# Patient Record
Sex: Female | Born: 1990 | Race: White | Hispanic: Yes | Marital: Single | Smoking: Never smoker
Health system: Southern US, Community
[De-identification: ages and names within clinical notes are randomized; demographics above are authoritative.]

## PROBLEM LIST (undated history)

## (undated) ENCOUNTER — Inpatient Hospital Stay (HOSPITAL_COMMUNITY): Payer: Self-pay

## (undated) DIAGNOSIS — Z789 Other specified health status: Secondary | ICD-10-CM

## (undated) HISTORY — PX: NO PAST SURGERIES: SHX2092

---
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
## 2013-04-16 ENCOUNTER — Ambulatory Visit: Payer: No Typology Code available for payment source | Attending: Internal Medicine

## 2013-04-30 ENCOUNTER — Encounter: Payer: Self-pay | Admitting: Internal Medicine

## 2013-04-30 ENCOUNTER — Ambulatory Visit: Payer: No Typology Code available for payment source | Attending: Internal Medicine | Admitting: Internal Medicine

## 2013-04-30 VITALS — BP 107/68 | HR 79 | Temp 98.7°F | Resp 14 | Ht 60.0 in | Wt 102.8 lb

## 2013-04-30 DIAGNOSIS — Z833 Family history of diabetes mellitus: Secondary | ICD-10-CM | POA: Insufficient documentation

## 2013-04-30 DIAGNOSIS — Z008 Encounter for other general examination: Secondary | ICD-10-CM | POA: Insufficient documentation

## 2013-04-30 DIAGNOSIS — N946 Dysmenorrhea, unspecified: Secondary | ICD-10-CM | POA: Insufficient documentation

## 2013-04-30 DIAGNOSIS — Z0000 Encounter for general adult medical examination without abnormal findings: Secondary | ICD-10-CM

## 2013-04-30 LAB — COMPLETE METABOLIC PANEL WITH GFR
ALK PHOS: 79 U/L (ref 39–117)
ALT: 12 U/L (ref 0–35)
AST: 17 U/L (ref 0–37)
Albumin: 4.3 g/dL (ref 3.5–5.2)
BUN: 11 mg/dL (ref 6–23)
CALCIUM: 9 mg/dL (ref 8.4–10.5)
CHLORIDE: 103 meq/L (ref 96–112)
CO2: 27 mEq/L (ref 19–32)
Creat: 0.63 mg/dL (ref 0.50–1.10)
GFR, Est Non African American: >89 mL/min
GLUCOSE: 94 mg/dL (ref 70–99)
POTASSIUM: 4.3 meq/L (ref 3.5–5.3)
Sodium: 140 mEq/L (ref 135–145)
Total Bilirubin: 0.4 mg/dL (ref 0.2–1.2)
Total Protein: 6.7 g/dL (ref 6.0–8.3)

## 2013-04-30 LAB — LIPID PANEL
Cholesterol: 151 mg/dL (ref 0–200)
HDL: 40 mg/dL (ref >39)
LDL CALC: 97 mg/dL (ref 0–99)
TRIGLYCERIDES: 69 mg/dL (ref <150)
Total CHOL/HDL Ratio: 3.8 Ratio
VLDL: 14 mg/dL (ref 0–40)

## 2013-04-30 LAB — CBC WITH DIFFERENTIAL/PLATELET
BASOS PCT: 1 % (ref 0–1)
Basophils Absolute: 0 10*3/uL (ref 0.0–0.1)
Eosinophils Absolute: 0.2 10*3/uL (ref 0.0–0.7)
Eosinophils Relative: 4 % (ref 0–5)
HEMATOCRIT: 35 % — AB (ref 36.0–46.0)
HEMOGLOBIN: 12.1 g/dL (ref 12.0–15.0)
LYMPHS PCT: 34 % (ref 12–46)
Lymphs Abs: 1.8 10*3/uL (ref 0.7–4.0)
MCH: 30 pg (ref 26.0–34.0)
MCHC: 34.6 g/dL (ref 30.0–36.0)
MCV: 86.6 fL (ref 78.0–100.0)
MONOS PCT: 10 % (ref 3–12)
Monocytes Absolute: 0.5 10*3/uL (ref 0.1–1.0)
NEUTROS ABS: 2.6 10*3/uL (ref 1.7–7.7)
NEUTROS PCT: 51 % (ref 43–77)
PLATELETS: 335 10*3/uL (ref 150–400)
RBC: 4.04 MIL/uL (ref 3.87–5.11)
RDW: 12.8 % (ref 11.5–15.5)
WBC: 5.1 10*3/uL (ref 4.0–10.5)

## 2013-04-30 LAB — TSH: TSH: 1.016 u[IU]/mL (ref 0.350–4.500)

## 2013-04-30 MED ORDER — HYDROCORTISONE 0.5 % EX OINT: 1.0000 "application " | TOPICAL_OINTMENT | Freq: Two times a day (BID) | CUTANEOUS | Status: DC

## 2013-04-30 NOTE — Progress Notes (Signed)
Patient ID: Destiny [name], female   DOB: 1990-04-03, 23 y.o.   MRN: [id number]   CC:  HPI: 23 year old female here to establish care. The patient has no complaints. She saw a V-shaped rash on her chest couple of weeks ago but that rash is improved without any treatment. She denied any recent history of travel and history of tick bite. No history of eczema or any other skin disorders Patient is otherwise in good health, she'll occasionally have severe menstrual cramps for which he takes ibuprofen. She is not interested in discussing contraception and is currently not sexually active. She is up-to-date with her childhood immunization and is not interested in vaccination today  Social history nonsmoker nonalcoholic  Family history father has asthma, grandmother has diabetes      Not on File History reviewed. No pertinent past medical history. No current outpatient prescriptions on file prior to visit.   No current facility-administered medications on file prior to visit.   Family History  Problem Relation Age of Onset  . Asthma Father   . Diabetes Maternal Grandmother   . Arthritis Paternal Grandmother    History   Social History  . Marital Status: Single    Spouse Name: N/A    Number of Children: N/A  . Years of Education: N/A   Occupational History  . Not on file.   Social History Main Topics  . Smoking status: Never Smoker   . Smokeless tobacco: Not on file  . Alcohol Use: No  . Drug Use: No  . Sexual Activity: Not on file   Other Topics Concern  . Not on file   Social History Narrative  . No narrative on file    Review of Systems  Constitutional: Negative for fever, chills, diaphoresis, activity change, appetite change and fatigue.  HENT: Negative for ear pain, nosebleeds, congestion, facial swelling, rhinorrhea, neck pain, neck stiffness and ear discharge.   Eyes: Negative for pain, discharge, redness, itching and visual disturbance.  Respiratory: Negative  for cough, choking, chest tightness, shortness of breath, wheezing and stridor.   Cardiovascular: Negative for chest pain, palpitations and leg swelling.  Gastrointestinal: Negative for abdominal distention.  Genitourinary: Negative for dysuria, urgency, frequency, hematuria, flank pain, decreased urine volume, difficulty urinating and dyspareunia.  Musculoskeletal: Negative for back pain, joint swelling, arthralgias and gait problem.  Neurological: Negative for dizziness, tremors, seizures, syncope, facial asymmetry, speech difficulty, weakness, light-headedness, numbness and headaches.  Hematological: Negative for adenopathy. Does not bruise/bleed easily.  Psychiatric/Behavioral: Negative for hallucinations, behavioral problems, confusion, dysphoric mood, decreased concentration and agitation.    Objective:   Filed Vitals:   04/30/13 1105  BP: 107/68  Pulse: 79  Temp: 98.7 F (37.1 C)  Resp: 14    Physical Exam  Constitutional: Appears well-developed and well-nourished. No distress.  HENT: Normocephalic. External right and left ear normal. Oropharynx is clear and moist.  Eyes: Conjunctivae and EOM are normal. PERRLA, no scleral icterus.  Neck: Normal ROM. Neck supple. No JVD. No tracheal deviation. No thyromegaly.  CVS: RRR, S1/S2 +, no murmurs, no gallops, no carotid bruit.  Pulmonary: Effort and breath sounds normal, no stridor, rhonchi, wheezes, rales.  Abdominal: Soft. BS +,  no distension, tenderness, rebound or guarding.  Musculoskeletal: Normal range of motion. No edema and no tenderness.  Lymphadenopathy: No lymphadenopathy noted, cervical, inguinal. Neuro: Alert. Normal reflexes, muscle tone coordination. No cranial nerve deficit. Skin: Skin is warm and dry. No rash noted. Not diaphoretic. No erythema. No pallor.  Psychiatric: Normal mood and affect. Behavior, judgment, thought content normal.   Lab Results  Component Value Date   WBC 5.7 04/25/2009   HGB 12.2 04/25/2009    HCT 36.5 04/25/2009   MCV 88.2 04/25/2009   PLT 331 04/25/2009   Lab Results  Component Value Date   CREATININE 0.69 04/25/2009   BUN 7 04/25/2009   NA 140 04/25/2009   K 3.7 04/25/2009   CL 104 04/25/2009   CO2 25 04/25/2009    No results found for this basename: HGBA1C   Lipid Panel  No results found for this basename: chol, trig, hdl, cholhdl, vldl, ldlcalc       Assessment and plan:   Patient Active Problem List   Diagnosis Date Noted  . SINUSITIS, MAXILLARY, ACUTE 03/26/2009  . ANEMIA, IRON DEFICIENCY NOS 10/27/2006  . ALLERGIC RHINITIS 10/27/2006  . PRIMARY DYSMENORRHEA 10/27/2006   Establish care Obtain baseline labs Gynecology referral for a Pap smear the patient has never had a Pap smear She will call us back if she wants to discuss options for contraception, although I did go over several different options available, she's not interested in pursuing any of them today She's not interested in immunization today she is up-to-date with her childhood immunization Hydrocortisone cream in case the patient's rash returns, the patient has also been advised to come back and see [place] if this happens  Otherwise followup in 6 months      The patient was given clear instructions to go to ER or return to medical center if symptoms don't improve, worsen or new problems develop. The patient verbalized understanding. The patient was told to call to get any lab results if not heard anything in the next week.

## 2013-04-30 NOTE — Progress Notes (Signed)
Pt is here to establish care. Requests an annual physical. Complains of light colored pink spots around collar bone x3 days. Spots come and go. No pain today.

## 2013-05-01 ENCOUNTER — Telehealth: Payer: Self-pay | Admitting: *Deleted

## 2013-05-01 NOTE — Telephone Encounter (Signed)
Message copied by Nayab Aten, [place] on Tue May 01, 2013  5:25 PM ------      Message from: Susie CassetteABROL MD, [facility]      Created: Tue May 01, 2013  4:19 PM       Notify patient of the labs are normal ------

## 2013-05-01 NOTE — Telephone Encounter (Signed)
Tried leaving a voicemail for patient to give us a call back.  Voicemail box is not set up yet. Unable to reach patient.

## 2013-10-29 ENCOUNTER — Ambulatory Visit: Payer: Self-pay | Admitting: Internal Medicine

## 2015-01-15 ENCOUNTER — Ambulatory Visit (INDEPENDENT_AMBULATORY_CARE_PROVIDER_SITE_OTHER): Payer: BLUE CROSS/BLUE SHIELD | Admitting: Family Medicine

## 2015-01-15 VITALS — BP 90/60 | HR 88 | Temp 98.5°F | Resp 16 | Ht 60.0 in | Wt 99.8 lb

## 2015-01-15 DIAGNOSIS — J029 Acute pharyngitis, unspecified: Secondary | ICD-10-CM | POA: Diagnosis not present

## 2015-01-15 MED ORDER — AMOXICILLIN 875 MG PO TABS: 875.0000 mg | ORAL_TABLET | Freq: Two times a day (BID) | ORAL | Status: DC

## 2015-01-15 NOTE — Progress Notes (Signed)
Patient ID: Destiny [name], female   DOB: 1990-08-14, 24 y.o.   MRN: [id number]  By signing my name below, I, Essence Howell, attest that this documentation has been prepared under the direction and in the presence of Elvina Sidle, MD Electronically Signed: Charline Bills, ED Scribe 01/15/2015 at 12:30 PM.  Patient ID: Destiny [name] MRN: [id number], DOB: Mar 27, 1990, 24 y.o. Date of Encounter: 01/15/2015, 12:17 PM  Primary Physician: Doris Cheadle, MD  Chief Complaint  Patient presents with  . Headache    x 1 week  . Abdominal Pain    x 4 days  . Dizziness    HPI: 24 y.o. year old female with history below presents with persistent frontal headaches for the past week. Pt reports associated chills, night sweats, dizziness, sore throat worsened with swallowing, mild dry cough, generalized body aches, nausea, 1 episode of emesis yesterday, abdominal pain for the past 4 days. No treatments tried PTA. No sick contacts. Pt's LNMP was last week; she denies possibility of pregnancy.   Pt works at a call center.   History reviewed. No pertinent past medical history.   Home Meds: Prior to Admission medications   Not on File    Allergies: No Known Allergies  Social History   Social History  . Marital Status: Single    Spouse Name: N/A  . Number of Children: N/A  . Years of Education: N/A   Occupational History  . Not on file.   Social History Main Topics  . Smoking status: Never Smoker   . Smokeless tobacco: Not on file  . Alcohol Use: No  . Drug Use: No  . Sexual Activity: Not on file   Other Topics Concern  . Not on file   Social History Narrative    Review of Systems: Constitutional: negative for fever, weight changes, or fatigue, +chills, +night sweats HEENT: negative for vision changes, hearing loss, congestion, rhinorrhea, epistaxis, or sinus pressure, +ST Cardiovascular: negative for chest pain or palpitations Respiratory: negative for hemoptysis, wheezing,  shortness of breath, +cough Abdominal: negative for diarrhea, or constipation, +abdominal pain, +nausea, +vomiting Msk: +myalgias  Dermatological: negative for rash Neurologic: negative for syncope, +headache, +dizziness,  All other systems reviewed and are otherwise negative with the exception to those above and in the HPI.  Physical Exam: Blood pressure 90/60, pulse 88, temperature 98.5 F (36.9 C), temperature source Oral, resp. rate 16, height 5' (1.524 m), weight 99 lb 12.8 oz (45.269 kg), last menstrual period 01/06/2015, SpO2 99 %., Body mass index is 19.49 kg/(m^2). General: Well developed, well nourished, in no acute distress. Head: Normocephalic, atraumatic, eyes without discharge, sclera non-icteric, nares are without discharge. Bilateral auditory canals clear, TM's are without perforation, pearly grey and translucent with reflective cone of light bilaterally. Oral cavity moist, posterior pharynx with mild erythema. No exudate, peritonsillar abscess, or post nasal drip. Neck: Supple. No thyromegaly. Full ROM. No lymphadenopathy. Lungs: Clear bilaterally to auscultation without wheezes, rales, or rhonchi. Breathing is unlabored. Heart: RRR with S1 S2. No murmurs, rubs, or gallops appreciated. Abdomen: Soft, non-tender, non-distended with normoactive bowel sounds. No hepatomegaly. No rebound/guarding. No obvious abdominal masses. Msk:  Strength and tone normal for age. Extremities/Skin: Warm and dry. No clubbing or cyanosis. No edema. No rashes or suspicious lesions. Neuro: Alert and oriented X 3. Moves all extremities spontaneously. Gait is normal. CNII-XII grossly in tact. Psych:  Responds to questions appropriately with a normal affect.     ASSESSMENT AND PLAN:  24 y.o. year  old female with acute sore throat, fever, and myalgia No diagnosis found.  This chart was scribed in my presence and reviewed by me personally.    ICD-9-CM ICD-10-CM   1. Sore throat 462 J02.9  amoxicillin (AMOXIL) 875 MG tablet     Culture, Group A Strep   Signed, Elvina SidleKurt Shawn Dannenberg, MD 01/15/2015 12:17 PM

## 2015-01-15 NOTE — Patient Instructions (Signed)

## 2015-01-17 LAB — CULTURE, GROUP A STREP: Organism ID, Bacteria: NORMAL

## 2015-02-07 ENCOUNTER — Ambulatory Visit: Payer: BLUE CROSS/BLUE SHIELD

## 2015-03-23 NOTE — L&D Delivery Note (Signed)
Delivery Note At 2:11 AM a viable female was delivered via Vaginal, Spontaneous Delivery (Presentation:LOA  ) after IOL with cytotec and pitocin for cholestasis.  APGAR: , 10;  Placenta status: delivered intact 31 minutes after delivery, complicated by full bladder that required catheter drainage Cord: 3 vessel without complications:  Anesthesia:  IV Fentanyl 100mcg Episiotomy: None Lacerations:  1st deg labial Suture Repair: no repair necessary Est. Blood Loss (mL):  150  Mom to postpartum.  Baby to Couplet care / Skin to Skin. All instruments and gauze accounted for and sharps disposed of.  Andres Egericia Brein, MD, PGY-1, MPH 10/28/2015, 3:04 AM   Midwife attestation: I was gloved and present for delivery in its entirety and I agree with the above resident's note.  LEFTWICH-KIRBY, Tezra Mahr, CNM 3:59 AM

## 2015-05-12 ENCOUNTER — Other Ambulatory Visit (HOSPITAL_COMMUNITY): Payer: Self-pay | Admitting: Nurse Practitioner

## 2015-05-12 DIAGNOSIS — Z3689 Encounter for other specified antenatal screening: Secondary | ICD-10-CM

## 2015-05-12 DIAGNOSIS — Z3A18 18 weeks gestation of pregnancy: Secondary | ICD-10-CM

## 2015-05-12 LAB — OB RESULTS CONSOLE RPR: RPR: NONREACTIVE

## 2015-05-12 LAB — OB RESULTS CONSOLE HIV ANTIBODY (ROUTINE TESTING): HIV: NONREACTIVE

## 2015-05-12 LAB — OB RESULTS CONSOLE GC/CHLAMYDIA
CHLAMYDIA, DNA PROBE: NEGATIVE
GC PROBE AMP, GENITAL: NEGATIVE

## 2015-05-12 LAB — OB RESULTS CONSOLE HEPATITIS B SURFACE ANTIGEN: Hepatitis B Surface Ag: NEGATIVE

## 2015-05-12 LAB — OB RESULTS CONSOLE RUBELLA ANTIBODY, IGM: Rubella: IMMUNE

## 2015-06-09 ENCOUNTER — Ambulatory Visit (HOSPITAL_COMMUNITY): Admission: RE | Admit: 2015-06-09 | Payer: Self-pay | Source: Ambulatory Visit

## 2015-06-16 ENCOUNTER — Ambulatory Visit (HOSPITAL_COMMUNITY)
Admission: RE | Admit: 2015-06-16 | Discharge: 2015-06-16 | Disposition: A | Discharge status: Home / Self Care | Payer: Medicaid Other | Source: Ambulatory Visit | Attending: Nurse Practitioner | Admitting: Nurse Practitioner

## 2015-06-16 DIAGNOSIS — Z3A19 19 weeks gestation of pregnancy: Secondary | ICD-10-CM | POA: Diagnosis not present

## 2015-06-16 DIAGNOSIS — Z3689 Encounter for other specified antenatal screening: Secondary | ICD-10-CM

## 2015-06-16 DIAGNOSIS — Z36 Encounter for antenatal screening of mother: Secondary | ICD-10-CM | POA: Diagnosis not present

## 2015-06-16 DIAGNOSIS — Z3A18 18 weeks gestation of pregnancy: Secondary | ICD-10-CM

## 2015-10-08 ENCOUNTER — Inpatient Hospital Stay (HOSPITAL_COMMUNITY)
Admission: AD | Admit: 2015-10-08 | Discharge: 2015-10-08 | Disposition: A | Discharge status: Home / Self Care | Payer: Medicaid Other | Source: Ambulatory Visit | Attending: Obstetrics & Gynecology | Admitting: Obstetrics & Gynecology

## 2015-10-08 ENCOUNTER — Encounter (HOSPITAL_COMMUNITY): Payer: Self-pay | Admitting: *Deleted

## 2015-10-08 DIAGNOSIS — R197 Diarrhea, unspecified: Secondary | ICD-10-CM

## 2015-10-08 DIAGNOSIS — O2693 Pregnancy related conditions, unspecified, third trimester: Secondary | ICD-10-CM | POA: Insufficient documentation

## 2015-10-08 DIAGNOSIS — O99613 Diseases of the digestive system complicating pregnancy, third trimester: Secondary | ICD-10-CM

## 2015-10-08 DIAGNOSIS — Z3A35 35 weeks gestation of pregnancy: Secondary | ICD-10-CM | POA: Insufficient documentation

## 2015-10-08 HISTORY — DX: Other specified health status: Z78.9

## 2015-10-08 LAB — URINALYSIS, ROUTINE W REFLEX MICROSCOPIC
Bilirubin Urine: NEGATIVE
Glucose, UA: NEGATIVE mg/dL
HGB URINE DIPSTICK: NEGATIVE
KETONES UR: NEGATIVE mg/dL
Leukocytes, UA: NEGATIVE
NITRITE: NEGATIVE
PROTEIN: NEGATIVE mg/dL
Specific Gravity, Urine: <1.005 — ABNORMAL LOW (ref 1.005–1.030)
pH: 6 (ref 5.0–8.0)

## 2015-10-08 NOTE — Discharge Instructions (Signed)
COLLECT THE STOOL SAMPLES AS INSTRUCTED AND BRING THEM TO THE [facility] FOR O&P, C-DIFF, AND STOOL CULTURE.   Fetal Movement Counts Patient Name: __________________________________________________ Patient Due Date: ____________________ Performing a fetal movement count is highly recommended in high-risk pregnancies, but it is good for every pregnant woman to do. Your health care provider may ask you to start counting fetal movements at 28 weeks of the pregnancy. Fetal movements often increase:  After eating a full meal.  After physical activity.  After eating or drinking something sweet or cold.  At rest. Pay attention to when you feel the baby is most active. This will help you notice a pattern of your baby's sleep and wake cycles and what factors contribute to an increase in fetal movement. It is important to perform a fetal movement count at the same time each day when your baby is normally most active.  HOW TO COUNT FETAL MOVEMENTS 1. Find a quiet and comfortable area to sit or lie down on your left side. Lying on your left side provides the best blood and oxygen circulation to your baby. 2. Write down the day and time on a sheet of paper or in a journal. 3. Start counting kicks, flutters, swishes, rolls, or jabs in a 2-hour period. You should feel at least 10 movements within 2 hours. 4. If you do not feel 10 movements in 2 hours, wait 2-3 hours and count again. Look for a change in the pattern or not enough counts in 2 hours. SEEK MEDICAL CARE IF:  You feel less than 10 counts in 2 hours, tried twice.  There is no movement in over an hour.  The pattern is changing or taking longer each day to reach 10 counts in 2 hours.  You feel the baby is not moving as he or she usually does. Date: ____________ Movements: ____________ Start time: ____________ Destiny [name] time: ____________  Date: ____________ Movements: ____________ Start time: ____________ Destiny [name] time:  ____________ Date: ____________ Movements: ____________ Start time: ____________ Destiny [name] time: ____________ Date: ____________ Movements: ____________ Start time: ____________ Destiny [name] time: ____________ Date: ____________ Movements: ____________ Start time: ____________ Destiny [name] time: ____________ Date: ____________ Movements: ____________ Start time: ____________ Destiny [name] time: ____________ Date: ____________ Movements: ____________ Start time: ____________ Destiny [name] time: ____________ Date: ____________ Movements: ____________ Start time: ____________ Destiny [name] time: ____________  Date: ____________ Movements: ____________ Start time: ____________ Destiny [name] time: ____________ Date: ____________ Movements: ____________ Start time: ____________ Destiny [name] time: ____________ Date: ____________ Movements: ____________ Start time: ____________ Destiny [name] time: ____________ Date: ____________ Movements: ____________ Start time: ____________ Destiny [name] time: ____________ Date: ____________ Movements: ____________ Start time: ____________ Destiny [name] time: ____________ Date: ____________ Movements: ____________ Start time: ____________ Destiny [name] time: ____________ Date: ____________ Movements: ____________ Start time: ____________ Destiny [name] time: ____________  Date: ____________ Movements: ____________ Start time: ____________ Destiny [name] time: ____________ Date: ____________ Movements: ____________ Start time: ____________ Destiny [name] time: ____________ Date: ____________ Movements: ____________ Start time: ____________ Destiny [name] time: ____________ Date: ____________ Movements: ____________ Start time: ____________ Destiny [name] time: ____________ Date: ____________ Movements: ____________ Start time: ____________ Destiny [name] time: ____________ Date: ____________ Movements: ____________ Start time: ____________ Destiny [name] time: ____________ Date: ____________ Movements: ____________ Start time: ____________ Destiny [name] time: ____________  Date: ____________ Movements:  ____________ Start time: ____________ Destiny [name] time: ____________ Date: ____________ Movements: ____________ Start time: ____________ Destiny [name] time: ____________ Date: ____________ Movements: ____________ Start time: ____________ Destiny [name] time: ____________ Date: ____________ Movements: ____________ Start time: ____________ Destiny [name] time: ____________ Date: ____________ Movements: ____________ Start time: ____________ Destiny [name] time: ____________ Date: ____________ Movements: ____________ Start time: ____________  Finish time: ____________ Date: ____________ Movements: ____________ Start time: ____________ Destiny [name] time: ____________  Date: ____________ Movements: ____________ Start time: ____________ Destiny [name] time: ____________ Date: ____________ Movements: ____________ Start time: ____________ Destiny [name] time: ____________ Date: ____________ Movements: ____________ Start time: ____________ Destiny [name] time: ____________ Date: ____________ Movements: ____________ Start time: ____________ Destiny [name] time: ____________ Date: ____________ Movements: ____________ Start time: ____________ Destiny [name] time: ____________ Date: ____________ Movements: ____________ Start time: ____________ Destiny [name] time: ____________ Date: ____________ Movements: ____________ Start time: ____________ Destiny [name] time: ____________  Date: ____________ Movements: ____________ Start time: ____________ Destiny [name] time: ____________ Date: ____________ Movements: ____________ Start time: ____________ Destiny [name] time: ____________ Date: ____________ Movements: ____________ Start time: ____________ Destiny [name] time: ____________ Date: ____________ Movements: ____________ Start time: ____________ Destiny [name] time: ____________ Date: ____________ Movements: ____________ Start time: ____________ Destiny [name] time: ____________ Date: ____________ Movements: ____________ Start time: ____________ Destiny [name] time: ____________ Date: ____________ Movements: ____________ Start time: ____________ Destiny [name]  time: ____________  Date: ____________ Movements: ____________ Start time: ____________ Destiny [name] time: ____________ Date: ____________ Movements: ____________ Start time: ____________ Destiny [name] time: ____________ Date: ____________ Movements: ____________ Start time: ____________ Destiny [name] time: ____________ Date: ____________ Movements: ____________ Start time: ____________ Destiny [name] time: ____________ Date: ____________ Movements: ____________ Start time: ____________ Destiny [name] time: ____________ Date: ____________ Movements: ____________ Start time: ____________ Destiny [name] time: ____________ Date: ____________ Movements: ____________ Start time: ____________ Destiny [name] time: ____________  Date: ____________ Movements: ____________ Start time: ____________ Destiny [name] time: ____________ Date: ____________ Movements: ____________ Start time: ____________ Destiny [name] time: ____________ Date: ____________ Movements: ____________ Start time: ____________ Destiny [name] time: ____________ Date: ____________ Movements: ____________ Start time: ____________ Destiny [name] time: ____________ Date: ____________ Movements: ____________ Start time: ____________ Destiny [name] time: ____________ Date: ____________ Movements: ____________ Start time: ____________ Destiny [name] time: ____________   This information is not intended to replace advice given to you by your health care provider. Make sure you discuss any questions you have with your health care provider.   Document Released: 04/07/2006 Document Revised: 03/29/2014 Document Reviewed: 01/03/2012 Elsevier Interactive Patient Education [employer] Third Trimester of Pregnancy The third trimester is from week 29 through week 42, months 7 through 9. The third trimester is a time when the fetus is growing rapidly. At the end of the ninth month, the fetus is about 20 inches in length and weighs 6-10 pounds.  BODY CHANGES Your body goes through many changes during pregnancy. The changes vary from woman to  woman.   Your weight will continue to increase. You can expect to gain 25-35 pounds (11-16 kg) by the end of the pregnancy.  You may begin to get stretch marks on your hips, abdomen, and breasts.  You may urinate more often because the fetus is moving lower into your pelvis and pressing on your bladder.  You may develop or continue to have heartburn as a result of your pregnancy.  You may develop constipation because certain hormones are causing the muscles that push waste through your intestines to slow down.  You may develop hemorrhoids or swollen, bulging veins (varicose veins).  You may have pelvic pain because of the weight gain and pregnancy hormones relaxing your joints between the bones in your pelvis. Backaches may result from overexertion of the muscles supporting your posture.  You may have changes in your hair. These can include thickening of your hair, rapid growth, and changes in texture. Some women also have hair loss during or after pregnancy, or hair that feels dry or thin. Your hair will most likely return to normal after your baby is born.  Your breasts will continue to  grow and be tender. A yellow discharge may leak from your breasts called colostrum.  Your belly button may stick out.  You may feel short of breath because of your expanding uterus.  You may notice the fetus "dropping," or moving lower in your abdomen.  You may have a bloody mucus discharge. This usually occurs a few days to a week before labor begins.  Your cervix becomes thin and soft (effaced) near your due date. WHAT TO EXPECT AT YOUR PRENATAL EXAMS  You will have prenatal exams every 2 weeks until week 36. Then, you will have weekly prenatal exams. During a routine prenatal visit: 5. You will be weighed to make sure you and the fetus are growing normally. 6. Your blood pressure is taken. 7. Your abdomen will be measured to track your baby's growth. 8. The fetal heartbeat will be listened  to. 9. Any test results from the previous visit will be discussed. 10. You may have a cervical check near your due date to see if you have effaced. At around 36 weeks, your caregiver will check your cervix. At the same time, your caregiver will also perform a test on the secretions of the vaginal tissue. This test is to determine if a type of bacteria, Group B streptococcus, is present. Your caregiver will explain this further. Your caregiver may ask you:  What your birth plan is.  How you are feeling.  If you are feeling the baby move.  If you have had any abnormal symptoms, such as leaking fluid, bleeding, severe headaches, or abdominal cramping.  If you are using any tobacco products, including cigarettes, chewing tobacco, and electronic cigarettes.  If you have any questions. Other tests or screenings that may be performed during your third trimester include:  Blood tests that check for low iron levels (anemia).  Fetal testing to check the health, activity level, and growth of the fetus. Testing is done if you have certain medical conditions or if there are problems during the pregnancy.  HIV (human immunodeficiency virus) testing. If you are at high risk, you may be screened for HIV during your third trimester of pregnancy. FALSE LABOR You may feel small, irregular contractions that eventually go away. These are called Braxton Hicks contractions, or false labor. Contractions may last for hours, days, or even weeks before true labor sets in. If contractions come at regular intervals, intensify, or become painful, it is best to be seen by your caregiver.  SIGNS OF LABOR   Menstrual-like cramps.  Contractions that are 5 minutes apart or less.  Contractions that start on the top of the uterus and spread down to the lower abdomen and back.  A sense of increased pelvic pressure or back pain.  A watery or bloody mucus discharge that comes from the vagina. If you have any of these  signs before the 37th week of pregnancy, call your caregiver right away. You need to go to the hospital to get checked immediately. HOME CARE INSTRUCTIONS   Avoid all smoking, herbs, alcohol, and unprescribed drugs. These chemicals affect the formation and growth of the baby.  Do not use any tobacco products, including cigarettes, chewing tobacco, and electronic cigarettes. If you need help quitting, ask your health care provider. You may receive counseling support and other resources to help you quit.  Follow your caregiver's instructions regarding medicine use. There are medicines that are either safe or unsafe to take during pregnancy.  Exercise only as directed by your caregiver. Experiencing uterine  cramps is a good sign to stop exercising.  Continue to eat regular, healthy meals.  Wear a good support bra for breast tenderness.  Do not use hot tubs, steam rooms, or saunas.  Wear your seat belt at all times when driving.  Avoid raw meat, uncooked cheese, cat litter boxes, and soil used by cats. These carry germs that can cause birth defects in the baby.  Take your prenatal vitamins.  Take 1500-2000 mg of calcium daily starting at the 20th week of pregnancy until you deliver your baby.  Try taking a stool softener (if your caregiver approves) if you develop constipation. Eat more high-fiber foods, such as fresh vegetables or fruit and whole grains. Drink plenty of fluids to keep your urine clear or pale yellow.  Take warm sitz baths to soothe any pain or discomfort caused by hemorrhoids. Use hemorrhoid cream if your caregiver approves.  If you develop varicose veins, wear support hose. Elevate your feet for 15 minutes, 3-4 times a day. Limit salt in your diet.  Avoid heavy lifting, wear low heal shoes, and practice good posture.  Rest a lot with your legs elevated if you have leg cramps or low back pain.  Visit your dentist if you have not gone during your pregnancy. Use a soft  toothbrush to brush your teeth and be gentle when you floss.  A sexual relationship may be continued unless your caregiver directs you otherwise.  Do not travel far distances unless it is absolutely necessary and only with the approval of your caregiver.  Take prenatal classes to understand, practice, and ask questions about the labor and delivery.  Make a trial run to the hospital.  Pack your hospital bag.  Prepare the baby's nursery.  Continue to go to all your prenatal visits as directed by your caregiver. SEEK MEDICAL CARE IF:  You are unsure if you are in labor or if your water has broken.  You have dizziness.  You have mild pelvic cramps, pelvic pressure, or nagging pain in your abdominal area.  You have persistent nausea, vomiting, or diarrhea.  You have a bad smelling vaginal discharge.  You have pain with urination. SEEK IMMEDIATE MEDICAL CARE IF:   You have a fever.  You are leaking fluid from your vagina.  You have spotting or bleeding from your vagina.  You have severe abdominal cramping or pain.  You have rapid weight loss or gain.  You have shortness of breath with chest pain.  You notice sudden or extreme swelling of your face, hands, ankles, feet, or legs.  You have not felt your baby move in over an hour.  You have severe headaches that do not go away with medicine.  You have vision changes.   This information is not intended to replace advice given to you by your health care provider. Make sure you discuss any questions you have with your health care provider.   Document Released: 03/02/2001 Document Revised: 03/29/2014 Document Reviewed: 05/09/2012 Elsevier Interactive Patient Education 2016 Else[employer] Inc[employer] Diarrhea Diarrhea is watery poop (stool). It can make you feel weak, tired, thirsty, or give you a dry mouth (signs of dehydration). Watery poop is a sign of another problem, most often an infection. It often lasts 2-3 days. It can last  longer if it is a sign of something serious. Take care of yourself as told by your doctor. HOME CARE   Drink 1 cup (8 ounces) of fluid each time you have watery poop.  Do not  drink the following fluids:  Those that contain simple sugars (fructose, glucose, galactose, lactose, sucrose, maltose).  Sports drinks.  Fruit juices.  Whole milk products.  Sodas.  Drinks with caffeine (coffee, tea, soda) or alcohol.  Oral rehydration solution may be used if the doctor says it is okay. You may make your own solution. Follow this recipe:   - teaspoon table salt.   teaspoon baking soda.   teaspoon salt substitute containing potassium chloride.  1 tablespoons sugar.  1 liter (34 ounces) of water.  Avoid the following foods:  High fiber foods, such as raw fruits and vegetables.  Nuts, seeds, and whole grain breads and cereals.   Those that are sweetened with sugar alcohols (xylitol, sorbitol, mannitol).  Try eating the following foods:  Starchy foods, such as rice, toast, pasta, low-sugar cereal, oatmeal, baked potatoes, crackers, and bagels.  Bananas.  Applesauce.  Eat probiotic-rich foods, such as yogurt and milk products that are fermented.  Wash your hands well after each time you have watery poop.  Only take medicine as told by your doctor.  Take a warm bath to help lessen burning or pain from having watery poop. GET HELP RIGHT AWAY IF:  11. You cannot drink fluids without throwing up (vomiting). 12. You keep throwing up. 13. You have blood in your poop, or your poop looks black and tarry. 14. You do not pee (urinate) in 6-8 hours, or there is only a small amount of very dark pee. 15. You have belly (abdominal) pain that gets worse or stays in the same spot (localizes). 16. You are weak, dizzy, confused, or light-headed. 17. You have a very bad headache. 18. Your watery poop gets worse or does not get better. 19. You have a fever or lasting symptoms for more  than 2-3 days. 20. You have a fever and your symptoms suddenly get worse. MAKE SURE YOU:   Understand these instructions.  Will watch your condition.  Will get help right away if you are not doing well or get worse.   This information is not intended to replace advice given to you by your health care provider. Make sure you discuss any questions you have with your health care provider.   Document Released: 08/25/2007 Document Revised: 03/29/2014 Document Reviewed: 11/14/2011 Elsevier Interactive Patient Education 2016 [employer] Inc[employer] Abdominal Pain During Pregnancy Abdominal pain is common in pregnancy. Most of the time, it does not cause harm. There are many causes of abdominal pain. Some causes are more serious than others. Some of the causes of abdominal pain in pregnancy are easily diagnosed. Occasionally, the diagnosis takes time to understand. Other times, the cause is not determined. Abdominal pain can be a sign that something is very wrong with the pregnancy, or the pain may have nothing to do with the pregnancy at all. For this reason, always tell your health care provider if you have any abdominal discomfort. HOME CARE INSTRUCTIONS  Monitor your abdominal pain for any changes. The following actions may help to alleviate any discomfort you are experiencing:  Do not have sexual intercourse or put anything in your vagina until your symptoms go away completely.  Get plenty of rest until your pain improves.  Drink clear fluids if you feel nauseous. Avoid solid food as long as you are uncomfortable or nauseous.  Only take over-the-counter or prescription medicine as directed by your health care provider.  Keep all follow-up appointments with your health care provider. SEEK IMMEDIATE MEDICAL CARE IF: 21. You  are bleeding, leaking fluid, or passing tissue from the vagina. 22. You have increasing pain or cramping. 23. You have persistent vomiting. 24. You have painful or bloody  urination. 25. You have a fever. 26. You notice a decrease in your baby's movements. 27. You have extreme weakness or feel faint. 28. You have shortness of breath, with or without abdominal pain. 29. You develop a severe headache with abdominal pain. 30. You have abnormal vaginal discharge with abdominal pain. 31. You have persistent diarrhea. 32. You have abdominal pain that continues even after rest, or gets worse. MAKE SURE YOU:   Understand these instructions.  Will watch your condition.  Will get help right away if you are not doing well or get worse.   This information is not intended to replace advice given to you by your health care provider. Make sure you discuss any questions you have with your health care provider.   Document Released: 03/08/2005 Document Revised: 12/27/2012 Document Reviewed: 10/05/2012 Elsevier Interactive Patient Education 2016 Else[employer] Inc[employer] Ball CorporationBraxton Hicks Contractions Contractions of the uterus can occur throughout pregnancy. Contractions are not always a sign that you are in labor.  WHAT ARE BRAXTON HICKS CONTRACTIONS?  Contractions that occur before labor are called Braxton Hicks contractions, or false labor. Toward the end of pregnancy (32-34 weeks), these contractions can develop more often and may become more forceful. This is not true labor because these contractions do not result in opening (dilatation) and thinning of the cervix. They are sometimes difficult to tell apart from true labor because these contractions can be forceful and people have different pain tolerances. You should not feel embarrassed if you go to the hospital with false labor. Sometimes, the only way to tell if you are in true labor is for your health care provider to look for changes in the cervix. If there are no prenatal problems or other health problems associated with the pregnancy, it is completely safe to be sent home with false labor and await the onset of true labor. HOW  CAN YOU TELL THE DIFFERENCE BETWEEN TRUE AND FALSE LABOR? False Labor  The contractions of false labor are usually shorter and not as hard as those of true labor.   The contractions are usually irregular.   The contractions are often felt in the front of the lower abdomen and in the groin.   The contractions may go away when you walk around or change positions while lying down.   The contractions get weaker and are shorter lasting as time goes on.   The contractions do not usually become progressively stronger, regular, and closer together as with true labor.  True Labor 33. Contractions in true labor last 30-70 seconds, become very regular, usually become more intense, and increase in frequency.  34. The contractions do not go away with walking.  35. The discomfort is usually felt in the top of the uterus and spreads to the lower abdomen and low back.  36. True labor can be determined by your health care provider with an exam. This will show that the cervix is dilating and getting thinner.  WHAT TO REMEMBER  Keep up with your usual exercises and follow other instructions given by your health care provider.   Take medicines as directed by your health care provider.   Keep your regular prenatal appointments.   Eat and drink lightly if you think you are going into labor.   If Braxton Hicks contractions are making you uncomfortable:   Change  your position from lying down or resting to walking, or from walking to resting.   Sit and rest in a tub of warm water.   Drink 2-3 glasses of water. Dehydration may cause these contractions.   Do slow and deep breathing several times an hour.  WHEN SHOULD I SEEK IMMEDIATE MEDICAL CARE? Seek immediate medical care if:  Your contractions become stronger, more regular, and closer together.   You have fluid leaking or gushing from your vagina.   You have a fever.   You pass blood-tinged mucus.   You have vaginal  bleeding.   You have continuous abdominal pain.   You have low back pain that you never had before.   You feel your baby's head pushing down and causing pelvic pressure.   Your baby is not moving as much as it used to.    This information is not intended to replace advice given to you by your health care provider. Make sure you discuss any questions you have with your health care provider.   Document Released: 03/08/2005 Document Revised: 03/13/2013 Document Reviewed: 12/18/2012 Elsevier Interactive Patient Education [employer]

## 2015-10-08 NOTE — MAU Provider Note (Signed)
History     CSN: [id number]  Arrival date and time: 10/08/15 2134   None     No chief complaint on file.  HPI Destiny [name] is a 25yo G1 @ 35.3wks who presents for eval of diarrhea x 1 week, approx 3x/day, and then approx 4-5x/day for the last 3 days. She also reports that the diarrhea was watery x 3 today. Denies fever; +nausea but no vomiting. Just generally has not felt well. Has been eating mostly normal. Denies leaking or bldg; no dysuria. Her preg has been followed by the [facility] and has been essentially unremarkable.  OB History    Gravida Para Term Preterm AB TAB SAB Ectopic Multiple Living   1               Past Medical History  Diagnosis Date  . Medical history non-contributory     Past Surgical History  Procedure Laterality Date  . No past surgeries      Family History  Problem Relation Age of Onset  . Asthma Father   . High Cholesterol Father   . Diabetes Maternal Grandmother   . Arthritis Paternal Grandmother     Social History  Substance Use Topics  . Smoking status: Never Smoker   . Smokeless tobacco: None  . Alcohol Use: No    Allergies: No Known Allergies  Prescriptions prior to admission  Medication Sig Dispense Refill Last Dose  . acetaminophen (TYLENOL) 325 MG tablet Take 650 mg by mouth every 6 (six) hours as needed for moderate pain.    Past Week at Unknown time  . Prenatal Vit-Fe Fumarate-FA (PRENATAL MULTIVITAMIN) TABS tablet Take 1 tablet by mouth daily at 12 noon.   10/07/2015 at Unknown time    ROS No other pertinents other than what is listed in HPI Physical Exam   Blood pressure 108/75, pulse 111, temperature 98.8 F (37.1 C), temperature source Oral, resp. rate 16, height 5' (1.524 m), weight 58.06 kg (128 lb), last menstrual period 01/06/2015, SpO2 96 %.  Physical Exam  Constitutional: She is oriented to person, place, and time. She appears well-developed.  HENT:  Head: Normocephalic.  Neck: Normal range of motion.   Cardiovascular: Normal rate.   Respiratory: Effort normal.  GI:  EFM 125-135, +accels, no decels Times or reg ctx q 3 mins, then more irreg; all mild  Genitourinary:  Cx C/L  Musculoskeletal: Normal range of motion.  Neurological: She is alert and oriented to person, place, and time.  Skin: Skin is warm and dry.  Psychiatric: She has a normal mood and affect. Her behavior is normal. Thought content normal.   Urinalysis    Component Value Date/Time   COLORURINE YELLOW 10/08/2015 2145   APPEARANCEUR CLEAR 10/08/2015 2145   LABSPEC <1.005* 10/08/2015 2145   PHURINE 6.0 10/08/2015 2145   GLUCOSEU NEGATIVE 10/08/2015 2145   HGBUR NEGATIVE 10/08/2015 2145   BILIRUBINUR NEGATIVE 10/08/2015 2145   [place] NEGATIVE 10/08/2015 2145   PROTEINUR NEGATIVE 10/08/2015 2145   NITRITE NEGATIVE 10/08/2015 2145   LEUKOCYTESUR NEGATIVE 10/08/2015 2145      MAU Course  Procedures  MDM NST read Cx exam UA ordered Sent home w/ collection kit for: C diff, O&P, stool culture  Assessment and Plan  IUP@35 .2wks Diarrhea  D/C home with containers for stool collection To bring them to the [facility] tomorrow where the orders can be entered and the results followed up on (msg sent to clinic staff) Eat as tol; continue with plenty of  fluids; no antidiarrheals F/U as scheduled at next OB appt  Serita Grammes CNM 10/08/2015, 11:56 PM

## 2015-10-08 NOTE — MAU Note (Signed)
Pt reports upper abd pain after she eats for the last 7 days, diarrhea for the last 3 days . Denies fever.

## 2015-10-09 ENCOUNTER — Other Ambulatory Visit: Payer: Self-pay | Admitting: *Deleted

## 2015-10-09 ENCOUNTER — Other Ambulatory Visit: Payer: Medicaid Other

## 2015-10-09 DIAGNOSIS — R197 Diarrhea, unspecified: Secondary | ICD-10-CM

## 2015-10-14 LAB — OVA AND PARASITE EXAMINATION: OP: NONE SEEN

## 2015-10-14 LAB — CLOSTRIDIUM DIFFICILE CULTURE-FECAL

## 2015-10-14 LAB — STOOL CULTURE

## 2015-10-17 LAB — OB RESULTS CONSOLE GBS: STREP GROUP B AG: NEGATIVE

## 2015-10-27 ENCOUNTER — Encounter (HOSPITAL_COMMUNITY): Payer: Self-pay

## 2015-10-27 ENCOUNTER — Inpatient Hospital Stay (HOSPITAL_COMMUNITY)
Admission: AD | Admit: 2015-10-27 | Discharge: 2015-10-29 | DRG: 775 | Disposition: A | Discharge status: Home / Self Care | Payer: Medicaid Other | Source: Ambulatory Visit | Attending: Obstetrics & Gynecology | Admitting: Obstetrics & Gynecology

## 2015-10-27 DIAGNOSIS — Z8349 Family history of other endocrine, nutritional and metabolic diseases: Secondary | ICD-10-CM

## 2015-10-27 DIAGNOSIS — Z3A38 38 weeks gestation of pregnancy: Secondary | ICD-10-CM

## 2015-10-27 DIAGNOSIS — Z833 Family history of diabetes mellitus: Secondary | ICD-10-CM

## 2015-10-27 DIAGNOSIS — K831 Obstruction of bile duct: Secondary | ICD-10-CM | POA: Diagnosis present

## 2015-10-27 DIAGNOSIS — O26619 Liver and biliary tract disorders in pregnancy, unspecified trimester: Secondary | ICD-10-CM

## 2015-10-27 DIAGNOSIS — Z825 Family history of asthma and other chronic lower respiratory diseases: Secondary | ICD-10-CM | POA: Diagnosis not present

## 2015-10-27 DIAGNOSIS — O2662 Liver and biliary tract disorders in childbirth: Principal | ICD-10-CM | POA: Diagnosis present

## 2015-10-27 LAB — CBC
HEMATOCRIT: 34.3 % — AB (ref 36.0–46.0)
HEMOGLOBIN: 12.3 g/dL (ref 12.0–15.0)
MCH: 32 pg (ref 26.0–34.0)
MCHC: 35.9 g/dL (ref 30.0–36.0)
MCV: 89.3 fL (ref 78.0–100.0)
Platelets: 299 10*3/uL (ref 150–400)
RBC: 3.84 MIL/uL — ABNORMAL LOW (ref 3.87–5.11)
RDW: 13.1 % (ref 11.5–15.5)
WBC: 7.7 10*3/uL (ref 4.0–10.5)

## 2015-10-27 LAB — ABO/RH: ABO/RH(D): O POS

## 2015-10-27 LAB — TYPE AND SCREEN
ABO/RH(D): O POS
ANTIBODY SCREEN: NEGATIVE

## 2015-10-27 MED ORDER — FENTANYL CITRATE (PF) 100 MCG/2ML IJ SOLN
100.0000 ug | INTRAMUSCULAR | Status: DC | PRN
  Administered 2015-10-27 (×4): 100 ug via INTRAVENOUS
  Filled 2015-10-27 (×4): qty 2

## 2015-10-27 MED ORDER — OXYTOCIN BOLUS FROM INFUSION
500.0000 mL | Freq: Once | INTRAVENOUS | Status: AC
  Administered 2015-10-28: 500 mL via INTRAVENOUS

## 2015-10-27 MED ORDER — ONDANSETRON HCL 4 MG/2ML IJ SOLN: 4.0000 mg | Freq: Four times a day (QID) | INTRAMUSCULAR | Status: DC | PRN

## 2015-10-27 MED ORDER — SOD CITRATE-CITRIC ACID 500-334 MG/5ML PO SOLN: 30.0000 mL | ORAL | Status: DC | PRN

## 2015-10-27 MED ORDER — OXYCODONE-ACETAMINOPHEN 5-325 MG PO TABS: 2.0000 | ORAL_TABLET | ORAL | Status: DC | PRN

## 2015-10-27 MED ORDER — MISOPROSTOL 25 MCG QUARTER TABLET
25.0000 ug | ORAL_TABLET | ORAL | Status: DC
  Administered 2015-10-27: 25 ug via VAGINAL
  Filled 2015-10-27 (×2): qty 0.25

## 2015-10-27 MED ORDER — LIDOCAINE HCL (PF) 1 % IJ SOLN
30.0000 mL | INTRAMUSCULAR | Status: DC | PRN
  Filled 2015-10-27: qty 30

## 2015-10-27 MED ORDER — OXYCODONE-ACETAMINOPHEN 5-325 MG PO TABS
1.0000 | ORAL_TABLET | ORAL | Status: DC | PRN
Start: 2015-10-27 — End: 2015-10-29
  Administered 2015-10-28 (×2): 1 via ORAL
  Filled 2015-10-27 (×2): qty 1

## 2015-10-27 MED ORDER — LACTATED RINGERS IV SOLN
INTRAVENOUS | Status: DC
  Administered 2015-10-27 (×2): via INTRAVENOUS

## 2015-10-27 MED ORDER — OXYTOCIN 40 UNITS IN LACTATED RINGERS INFUSION - SIMPLE MED
2.5000 [IU]/h | INTRAVENOUS | Status: DC
  Filled 2015-10-27: qty 1000

## 2015-10-27 MED ORDER — ACETAMINOPHEN 325 MG PO TABS: 650.0000 mg | ORAL_TABLET | ORAL | Status: DC | PRN

## 2015-10-27 MED ORDER — LACTATED RINGERS IV SOLN: 500.0000 mL | INTRAVENOUS | Status: DC | PRN

## 2015-10-27 NOTE — Anesthesia Pain Management Evaluation Note (Signed)
  CRNA Pain Management Visit Note  Patient: Destiny [name], 25 y.o., female  "Hello I am a member of the anesthesia team at [facility]. We have an anesthesia team available at all times to provide care throughout the hospital, including epidural management and anesthesia for C-section. I don't know your plan for the delivery whether it a natural birth, water birth, IV sedation, nitrous supplementation, doula or epidural, but we want to meet your pain goals."   1.Was your pain managed to your expectations on prior hospitalizations?   No prior hospitalizations  2.What is your expectation for pain management during this hospitalization?     IV pain meds, possibly epidural  3.How can we help you reach that goal? Nursing support and IV medication  Record the patient's initial score and the patient's pain goal.   Pain: 1  Pain Goal: expects 0-10, goal with IV meds 6-7 [facility] wants you to be able to say your pain was always managed very well.  [facility] Center[facility],Emaad Nanna 10/27/2015

## 2015-10-28 ENCOUNTER — Encounter (HOSPITAL_COMMUNITY): Payer: Self-pay | Admitting: *Deleted

## 2015-10-28 DIAGNOSIS — K831 Obstruction of bile duct: Secondary | ICD-10-CM

## 2015-10-28 DIAGNOSIS — O2662 Liver and biliary tract disorders in childbirth: Secondary | ICD-10-CM

## 2015-10-28 DIAGNOSIS — Z3A38 38 weeks gestation of pregnancy: Secondary | ICD-10-CM

## 2015-10-28 LAB — RPR: RPR Ser Ql: NONREACTIVE

## 2015-10-28 MED ORDER — PRENATAL MULTIVITAMIN CH
1.0000 | ORAL_TABLET | Freq: Every day | ORAL | Status: DC
  Administered 2015-10-28 – 2015-10-29 (×2): 1 via ORAL
  Filled 2015-10-28: qty 1

## 2015-10-28 MED ORDER — WITCH HAZEL-GLYCERIN EX PADS: 1.0000 "application " | MEDICATED_PAD | CUTANEOUS | Status: DC | PRN

## 2015-10-28 MED ORDER — SENNOSIDES-DOCUSATE SODIUM 8.6-50 MG PO TABS
2.0000 | ORAL_TABLET | ORAL | Status: DC
  Filled 2015-10-28: qty 2

## 2015-10-28 MED ORDER — BENZOCAINE-MENTHOL 20-0.5 % EX AERO
1.0000 "application " | INHALATION_SPRAY | CUTANEOUS | Status: DC | PRN
  Administered 2015-10-28: 1 via TOPICAL
  Filled 2015-10-28: qty 56

## 2015-10-28 MED ORDER — ONDANSETRON HCL 4 MG/2ML IJ SOLN: 4.0000 mg | INTRAMUSCULAR | Status: DC | PRN

## 2015-10-28 MED ORDER — DIPHENHYDRAMINE HCL 25 MG PO CAPS: 25.0000 mg | ORAL_CAPSULE | Freq: Four times a day (QID) | ORAL | Status: DC | PRN

## 2015-10-28 MED ORDER — ONDANSETRON HCL 4 MG PO TABS: 4.0000 mg | ORAL_TABLET | ORAL | Status: DC | PRN

## 2015-10-28 MED ORDER — ACETAMINOPHEN 325 MG PO TABS: 650.0000 mg | ORAL_TABLET | ORAL | Status: DC | PRN

## 2015-10-28 MED ORDER — COCONUT OIL OIL
1.0000 "application " | TOPICAL_OIL | Status: DC | PRN
  Administered 2015-10-29: 1 via TOPICAL
  Filled 2015-10-28: qty 120

## 2015-10-28 MED ORDER — ZOLPIDEM TARTRATE 5 MG PO TABS: 5.0000 mg | ORAL_TABLET | Freq: Every evening | ORAL | Status: DC | PRN

## 2015-10-28 MED ORDER — IBUPROFEN 600 MG PO TABS
600.0000 mg | ORAL_TABLET | Freq: Four times a day (QID) | ORAL | Status: DC
  Administered 2015-10-28 – 2015-10-29 (×6): 600 mg via ORAL
  Filled 2015-10-28 (×5): qty 1

## 2015-10-28 MED ORDER — DIBUCAINE 1 % RE OINT: 1.0000 "application " | TOPICAL_OINTMENT | RECTAL | Status: DC | PRN

## 2015-10-28 MED ORDER — TETANUS-DIPHTH-ACELL PERTUSSIS 5-2.5-18.5 LF-MCG/0.5 IM SUSP: 0.5000 mL | Freq: Once | INTRAMUSCULAR | Status: DC

## 2015-10-28 MED ORDER — SIMETHICONE 80 MG PO CHEW: 80.0000 mg | CHEWABLE_TABLET | ORAL | Status: DC | PRN

## 2015-10-28 NOTE — Lactation Note (Signed)
This note was copied from a baby's chart. Lactation Consultation Note  Patient Name: Destiny [name]: 10/28/2015 Reason for consult: Follow-up assessment Baby at 20 hr of life and mom called for latch help. Mom has small nipples and firm breasts. Baby was only latching to the nipple. Demonstrated how to get a deeper latch. Had mom take the heavy/fluffly blanket off while feeding and just leave the think flannel blanket. Mom was using breast compressions to keep the baby sucking. Reviewed nipple care. She is aware of lactation services and support group. She will call as needed.   Maternal Data    Feeding Feeding Type: Breast Fed Length of feed: 2 min  LATCH Score/Interventions Latch: Repeated attempts needed to sustain latch, nipple held in mouth throughout feeding, stimulation needed to elicit sucking reflex. Intervention(s): Skin to skin;Waking techniques Intervention(s): Adjust position;Assist with latch;Breast compression  Audible Swallowing: A few with stimulation Intervention(s): Hand expression;Skin to skin Intervention(s): Alternate breast massage  Type of Nipple: Everted at rest and after stimulation  Comfort (Breast/Nipple): Soft / non-tender     Hold (Positioning): Full assist, staff holds infant at breast Intervention(s): Support Pillows;Position options  LATCH Score: 6  Lactation Tools Discussed/Used     Consult Status Consult Status: Follow-up [name]: 10/29/15 Follow-up type: In-patient    Rulon Eisenmengerlizabeth E Alven Alverio 10/28/2015, 10:13 PM

## 2015-10-28 NOTE — Lactation Note (Signed)
This note was copied from a baby's chart. Lactation Consultation Note  Patient Name: Destiny [name] GMWNU'UToday's Date: 10/28/2015 Reason for consult: Initial assessment Baby at 14 hr of life. Mom reports baby is bf well. She denies breast or nipple pain, voiced no concerns. Discussed baby behavior, feeding frequency, baby belly size, supplementing, voids, wt loss, breast changes, and nipple care. Mom stated she can manually express and has spoon in room. Given lactation handouts. Aware of OP services and support group.    Maternal Data Has patient been taught Hand Expression?: Yes  Feeding Feeding Type: Breast Fed Length of feed: 10 min  LATCH Score/Interventions                      Lactation Tools Discussed/Used WIC Program: Yes   Consult Status Consult Status: Follow-up Date: 10/29/15 Follow-up type: In-patient    Destiny [name] 10/28/2015, 5:03 PM

## 2015-10-29 MED ORDER — IBUPROFEN 600 MG PO TABS: 600.0000 mg | ORAL_TABLET | Freq: Four times a day (QID) | ORAL | 1 refills | Status: DC | PRN

## 2015-10-29 NOTE — Lactation Note (Signed)
This note was copied from a baby's chart. Lactation Consultation Note  Patient Name: Destiny [name] ZOXWR'UToday's Date: 10/29/2015 Reason for consult: Follow-up assessment;Difficult latch Baby 37 hours old. Mom reports that the baby has been staying on the tip of her nipple and she has had nipple soreness. Mom has been nursing in cradle position, so discussed supporting baby's head in the cross-cradle position. Assisted mom to latch baby in football position to left breast. Baby latch deeply and suckled rhythmically with a few swallows noted. Demonstrated to mom how to flange baby's lower lip and mom reported increased comfort. Enc mom to use EBM on nipples after nursing and to make sure to maintain a deep latch to avoid nipple trauma/soreness. Enc mom to unwrap baby when nursing and to undress her if she is sleepy at the breast. Discussed how having the baby more alert at the beginning of the BF will assist with a deeper latch. Demonstrated to mom how to stimulate baby to keep her suckling as well.   Mom given a hand pump with review, and discussed pumping/nursing and returning to work. Mom aware of OP/BFSG and LC phone line assistance after D/C.   Maternal Data    Feeding Feeding Type: Breast Fed Length of feed:  (LC assessed first 15 minutes of BF.)  LATCH Score/Interventions Latch: Grasps breast easily, tongue down, lips flanged, rhythmical sucking. Intervention(s): Skin to skin;Teach feeding cues;Waking techniques Intervention(s): Adjust position;Assist with latch;Breast compression  Audible Swallowing: A few with stimulation Intervention(s): Skin to skin;Hand expression  Type of Nipple: Everted at rest and after stimulation  Comfort (Breast/Nipple): Filling, red/small blisters or bruises, mild/mod discomfort  Problem noted: Mild/Moderate discomfort Interventions (Mild/moderate discomfort): Hand expression  Hold (Positioning): Assistance needed to correctly position infant at breast  and maintain latch. Intervention(s): Breastfeeding basics reviewed;Support Pillows;Position options;Skin to skin  LATCH Score: 7  Lactation Tools Discussed/Used     Consult Status Consult Status: PRN    Destiny [name], Evanthia Maund 10/29/2015, 3:18 PM

## 2015-10-29 NOTE — H&P (Signed)
LABOR AND DELIVERY ADMISSION HISTORY AND PHYSICAL NOTE  Destiny [name] is a 25 y.o. female G1P1001 with IUP at [redacted]w[redacted]d by LMP presenting for IOL for cholestasis.   She reports positive fetal movement. She denies leakage of fluid or vaginal bleeding.  Prenatal History/Complications:  Past Medical History: Past Medical History:  Diagnosis Date  . Medical history non-contributory     Past Surgical History: Past Surgical History:  Procedure Laterality Date  . NO PAST SURGERIES      Obstetrical History: OB History    Gravida Para Term Preterm AB Living   SAB TAB Ectopic Multiple Live Births         0 1      Social History: Social History   Social History  . Marital status: Single    Spouse name: N/A  . Number of children: N/A  . Years of education: N/A   Social History Main Topics  . Smoking status: Never Smoker  . Smokeless tobacco: Never Used  . Alcohol use No  . Drug use: No  . Sexual activity: Yes   Other Topics Concern  . None   Social History Narrative  . None    Family History: Family History  Problem Relation Age of Onset  . Asthma Father   . High Cholesterol Father   . Diabetes Maternal Grandmother   . Arthritis Paternal Grandmother     Allergies: No Known Allergies  Prescriptions Prior to Admission  Medication Sig Dispense Refill Last Dose  . acetaminophen (TYLENOL) 325 MG tablet Take 650 mg by mouth every 6 (six) hours as needed for moderate pain.    10/26/2015 at Unknown time  . Prenatal Vit-Fe Fumarate-FA (PRENATAL MULTIVITAMIN) TABS tablet Take 1 tablet by mouth daily at 12 noon.   10/26/2015 at Unknown time     Review of Systems   All systems reviewed and negative except as stated in HPI  Blood pressure 116/68, pulse 80, temperature 98.3 F (36.8 C), temperature source Oral, resp. rate 18, height 5' (1.524 m), weight 59.4 kg (131 lb), last menstrual period 01/06/2015, SpO2 99 %, unknown if currently breastfeeding. General  appearance: alert, cooperative and no distress Lungs: clear to auscultation bilaterally Heart: regular rate and rhythm Abdomen: soft, non-tender; bowel sounds normal Extremities: No calf swelling or tenderness Presentation: cephalic Fetal monitoring: cat 1 Uterine activity: UCs q75min Dilation: 10 Effacement (%): 80 Station: +1 Exam by:: McCall   Prenatal labs: ABO, Rh: --/--/O POS, O POS (08/07 1210) Antibody: NEG (08/07 1210) Rubella: !Error! RPR: Non Reactive (08/07 1210)  HBsAg: Negative (02/20 0000)  HIV: Non-reactive (02/20 0000)  GBS: Negative (07/28 0000)  1 hr Glucola: passed Genetic screening:  CF neg, quad too late Anatomy US: normal female  Prenatal Transfer Tool  Maternal Diabetes: No Genetic Screening: Normal Maternal Ultrasounds/Referrals: Normal Fetal Ultrasounds or other Referrals:  None Maternal Substance Abuse:  No Significant Maternal Medications:  None Significant Maternal Lab Results: None  No results found for this or any previous visit (from the past 24 hour(s)).  Patient Active Problem List   Diagnosis Date Noted  . Normal delivery 10/28/2015  . Cholestasis of pregnancy 10/27/2015  . SINUSITIS, MAXILLARY, ACUTE 03/26/2009  . ANEMIA, IRON DEFICIENCY NOS 10/27/2006  . ALLERGIC RHINITIS 10/27/2006  . PRIMARY DYSMENORRHEA 10/27/2006    Assessment: Destiny [name] is a 25 y.o. G1P1001 at [redacted]w[redacted]d here for IOL for cholestasis.  #Labor: cytotec, consider foley #Pain:  Desires none  #FWB: Cat 1 #ID:  GBS neg #MOF: breast #MOC:depo  Loni MuseKate Kajsa Butrum 10/29/2015, 8:58 AM

## 2015-10-29 NOTE — Discharge Summary (Signed)
OB Discharge Summary     Patient Name: Destiny [name] DOB: 1990/06/26 MRN: [id number]  Date of admission: 10/27/2015 Delivering MD: Andres EgeBREIN, TRICIA R   Date of discharge: 10/29/2015  Admitting diagnosis: 38wks, indcution Intrauterine pregnancy: 6446w1d     Secondary diagnosis:  Active Problems:   Cholestasis of pregnancy   Normal delivery  Additional problems: none     Discharge diagnosis: Term Pregnancy Delivered                                                                                                Post partum procedures:none  Augmentation: none  Complications: None  Hospital course:  Onset of Labor With Vaginal Delivery     25 y.o. yo G1P1001 at 2146w1d was admitted in Active Labor on 10/27/2015. Patient had an uncomplicated labor course as follows:  Membrane Rupture Time/Date: 12:45 AM ,10/28/2015   Intrapartum Procedures: Episiotomy: None [1]                                         Lacerations:  1st degree [2];Labial [10]  Patient had a delivery of a Viable infant. 10/28/2015  Information for the patient's newborn:  Scherry RanMorejon, Girl Ebbie [[id number]][[id number]]  Delivery Method: Vaginal, Spontaneous Delivery (Filed from Delivery Summary)    Pateint had an uncomplicated postpartum course.  She is ambulating, tolerating a regular diet, passing flatus, and urinating well. Patient is discharged home in stable condition on 10/29/15.    Physical exam Vitals:   10/28/15 0815 10/28/15 1546 10/28/15 1735 10/29/15 0632  BP: (!) 108/58 102/60 114/75 116/68  Pulse: 68 75 84 80  Resp: 16 18 17 18   Temp: 98.8 F (37.1 C) 98.9 F (37.2 C) 98.4 F (36.9 C) 98.3 F (36.8 C)  TempSrc: Oral Oral Oral Oral  SpO2:  99%    Weight:      Height:       General: alert, cooperative and no distress Lochia: appropriate Uterine Fundus: firm Incision: N/A DVT Evaluation: No evidence of DVT seen on physical exam. Labs: Lab Results  Component Value Date   WBC 7.7 10/27/2015   HGB 12.3 10/27/2015   HCT 34.3 (L) 10/27/2015   MCV 89.3 10/27/2015   PLT 299 10/27/2015   CMP Latest Ref Rng & Units 04/30/2013  Glucose 70 - 99 mg/dL 94  BUN 6 - 23 mg/dL 11  Creatinine 9.520.50 - 8.411.10 mg/dL 3.240.63  Sodium 401135 - 027145 mEq/L 140  Potassium 3.5 - 5.3 mEq/L 4.3  Chloride 96 - 112 mEq/L 103  CO2 19 - 32 mEq/L 27  Calcium 8.4 - 10.5 mg/dL 9.0  Total Protein 6.0 - 8.3 g/dL 6.7  Total Bilirubin 0.2 - 1.2 mg/dL 0.4  Alkaline Phos 39 - 117 U/L 79  AST 0 - 37 U/L 17  ALT 0 - 35 U/L 12    Discharge instruction: per After Visit Summary and "Baby and Me Booklet".  After visit meds:    Medication List    STOP taking  these medications   acetaminophen 325 MG tablet Commonly known as:  TYLENOL     TAKE these medications   ibuprofen 600 MG tablet Commonly known as:  ADVIL,MOTRIN Take 1 tablet (600 mg total) by mouth every 6 (six) hours as needed.   prenatal multivitamin Tabs tablet Take 1 tablet by mouth daily at 12 noon.       Diet: routine diet  Activity: Advance as tolerated. Pelvic rest for 6 weeks.   Outpatient follow up:6 weeks Follow up Appt:No future appointments. Follow up Visit:No Follow-up on file.  Postpartum contraception: Depo Provera  Newborn Data: Live born female  Birth Weight: 6 lb 5.4 oz (2875 g) APGAR: , 10  Baby Feeding: Breast Disposition:home with mother   10/29/2015 POE,DEIRDRE, CNM

## 2015-10-29 NOTE — Discharge Instructions (Signed)
Parto vaginal, Cuidados posteriores  °(Vaginal Delivery, Care After) °Siga estas instrucciones durante las próximas semanas. Estas indicaciones para el alta le proporcionan información general acerca de cómo deberá cuidarse después del parto. El médico también podrá darle instrucciones específicas. El tratamiento ha sido planificado según las prácticas médicas actuales, pero en algunos casos pueden ocurrir problemas. Comuníquese con el médico si tiene algún problema o tiene preguntas al volver a su casa.  °INSTRUCCIONES PARA EL CUIDADO EN EL HOGAR  °· Tome sólo medicamentos de venta libre o recetados, según las indicaciones del médico o del farmacéutico. °· No beba alcohol, especialmente si está amamantando o toma analgésicos. °· No mastique tabaco ni fume. °· No consuma drogas. °· Continúe con un adecuado cuidado perineal. El buen cuidado perineal incluye: °¨ Higienizarse de adelante hacia atrás. °¨ Mantener la zona perineal limpia. °· No use tampones ni duchas vaginales hasta que su médico la autorice. °· Dúchese, lávese el cabello y tome baños de inmersión según las indicaciones de su médico. °· Utilice un sostén que le ajuste bien y que brinde buen soporte a sus mamas. °· Consuma alimentos saludables. °· Beba suficiente líquido para mantener la orina clara o de color amarillo pálido. °· Consuma alimentos ricos en fibra como cereales y panes integrales, arroz, frijoles y frutas y verduras frescas todos los días. Estos alimentos pueden ayudarla a prevenir o aliviar el estreñimiento. °· Siga las recomendaciones de su médico relacionadas con la reanudación de actividades como subir escaleras, conducir automóviles, levantar objetos, hacer ejercicios o viajar. °· Hable con su médico acerca de reanudar la actividad sexual. Volver a la actividad sexual depende del riesgo de infección, la velocidad de la curación y la comodidad y su deseo de reanudarla. °· Trate de que alguien la ayude con las actividades del hogar y con  el recién nacido al menos durante un par de días después de salir del hospital. °· Descanse todo lo que pueda. Trate de descansar o tomar una siesta mientras el bebé está durmiendo. °· Aumente sus actividades gradualmente. °· Cumpla con todas las visitas de control programadas para después del parto. Es muy importante asistir a todas las citas programadas de seguimiento. En estas citas, su médico va a controlarla para asegurarse de que esté sanando física y emocionalmente. °SOLICITE ATENCIÓN MÉDICA SI:  °· Elimina coágulos grandes por la vagina. Guarde algunos coágulos para mostrarle al médico. °· Tiene una secreción con feo olor que proviene de la vagina. °· Tiene dificultad para orinar. °· Orina con frecuencia. °· Siente dolor al orinar. °· Nota un cambio en sus movimientos intestinales. °· Aumenta el enrojecimiento, el dolor o la hinchazón en la zona de la incisión vaginal (episiotomía) o el desgarro vaginal. °· Tiene pus que drena por la episiotomía o el desgarro vaginal. °· La episiotomía o el desgarro vaginal se abren. °· Sus mamas le duelen, están duras o enrojecidas. °· Sufre un dolor intenso de cabeza. °· Tiene visión borrosa o ve manchas. °· Se siente triste o deprimida. °· Tiene pensamientos acerca de lastimarse o dañar al recién nacido. °· Tiene preguntas acerca de su cuidado personal, el cuidado del recién nacido o acerca de los medicamentos. °· Se siente mareada o sufre un desmayo. °· Tiene una erupción. °· Tiene náuseas o vómitos. °· Usted amamantó al bebé y no ha tenido su período menstrual dentro de las 12 semanas después de dejar de amamantar. °· No amamanta al bebé y no tuvo su período menstrual en las últimas 12° semanas después del   parto. °· Tiene fiebre. °SOLICITE ATENCIÓN MÉDICA DE INMEDIATO SI:  °· Siente dolor persistente. °· Siente dolor en el pecho. °· Le falta el aire. °· Se desmaya. °· Siente dolor en la pierna. °· Siente dolor en el estómago. °· El sangrado vaginal satura dos o más  apósitos en 1 hora. °  °Esta información no tiene como fin reemplazar el consejo del médico. Asegúrese de hacerle al médico cualquier pregunta que tenga. °  °Document Released: 03/08/2005 Document Revised: 11/27/2014 °Elsevier Interactive Patient Education ©2016 [employer] Inc[employer] ° °

## 2015-10-30 ENCOUNTER — Encounter (HOSPITAL_COMMUNITY): Payer: Self-pay | Admitting: *Deleted

## 2016-07-30 IMAGING — US US MFM OB COMP +14 WKS
1 series · 14 of 28 positions shown · non-contrast
Comparison: none

[Series 1: us mfm ob comp +14 wks · 14 of 98 slices shown]
[im 4/98]
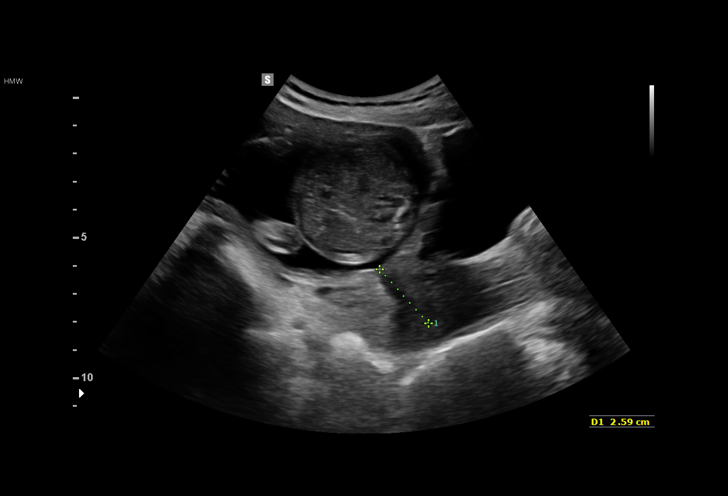
[im 11/98]
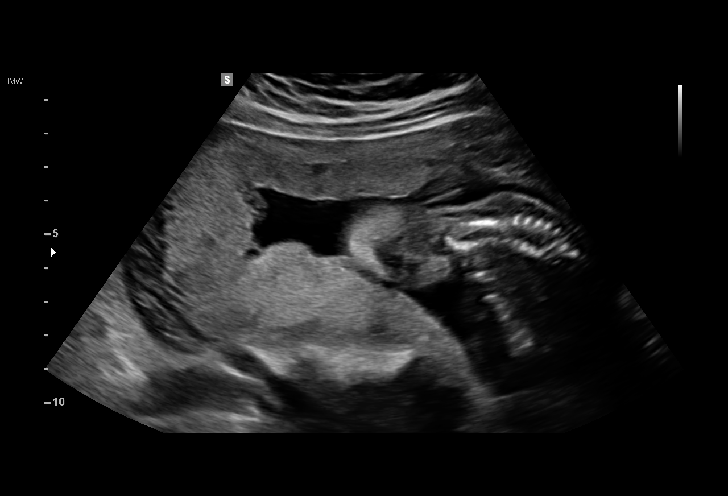
[im 18/98]
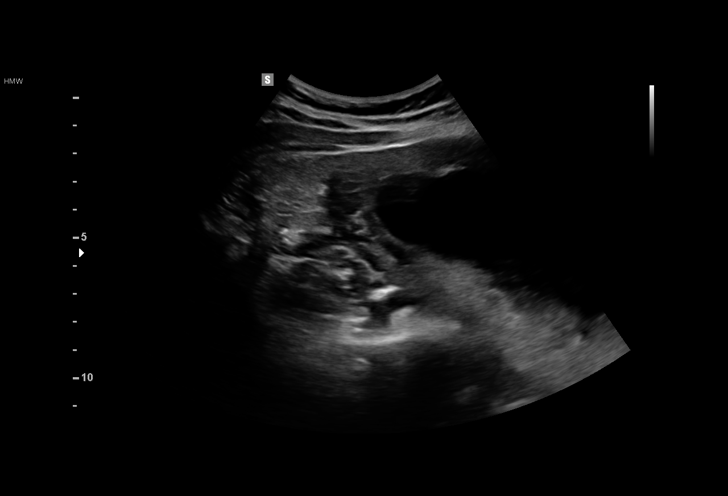
[im 26/98]
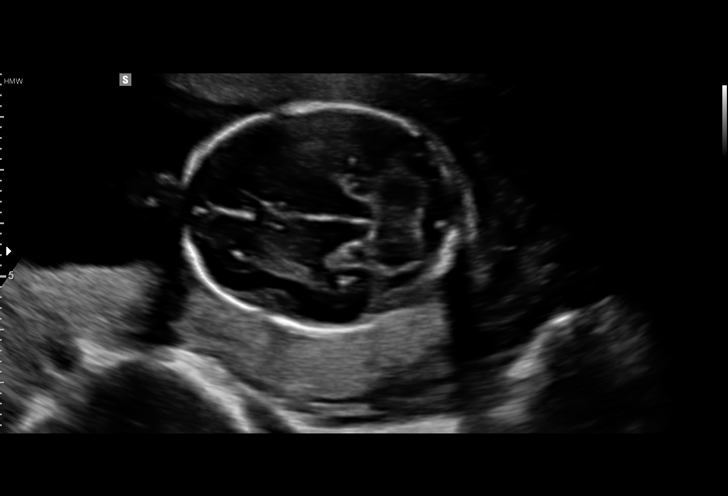
[im 33/98]
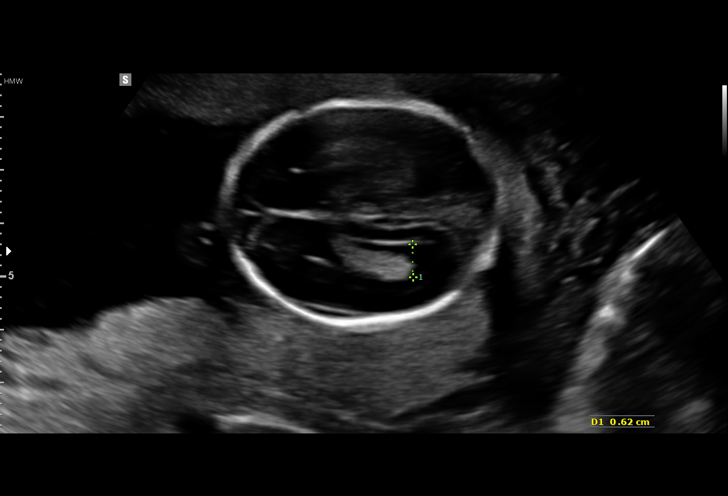
[im 40/98]
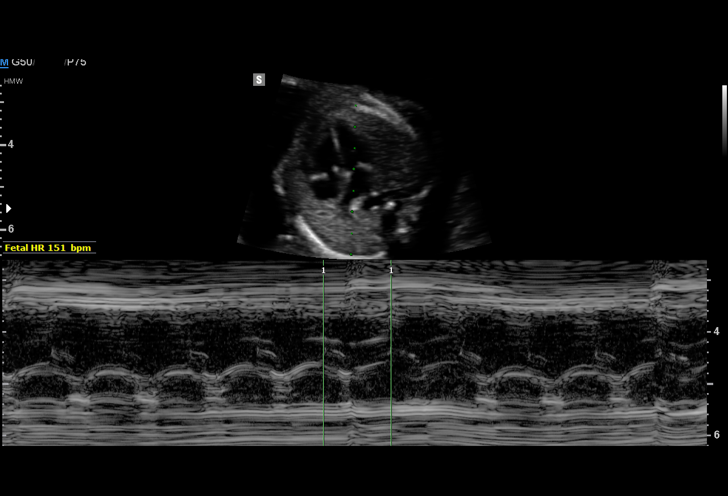
[im 47/98]
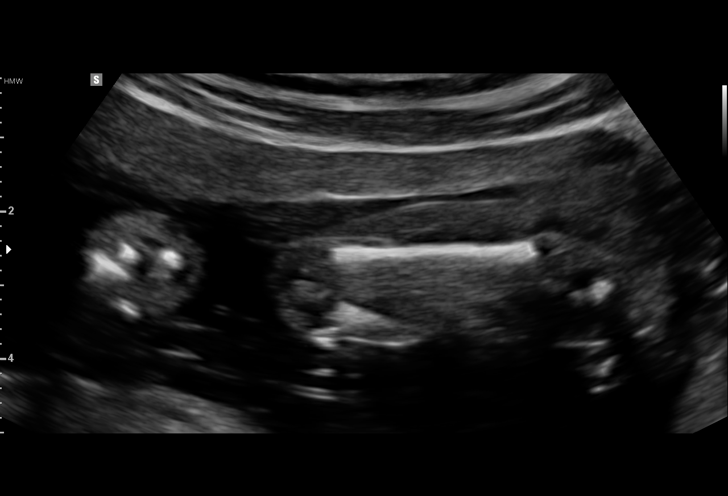
[im 54/98]
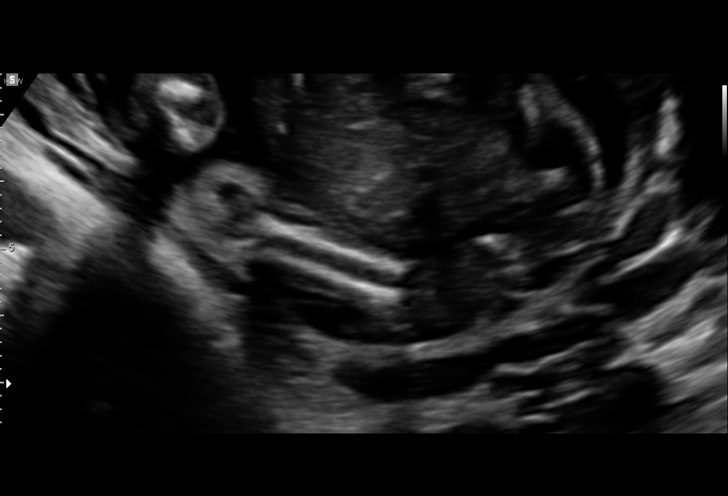
[im 62/98]
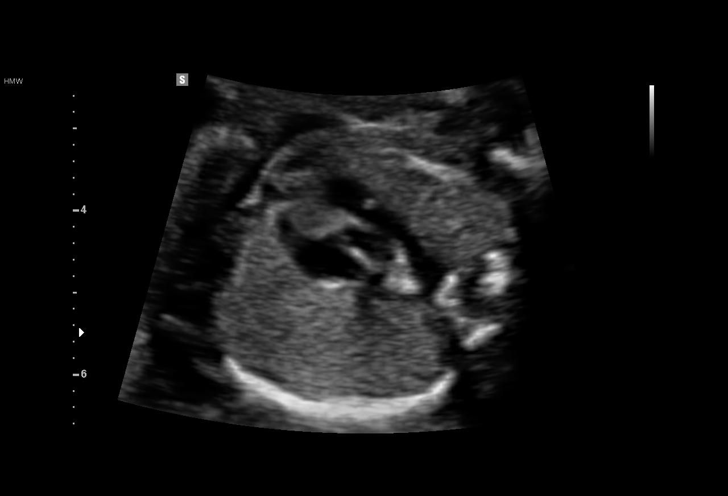
[im 69/98]
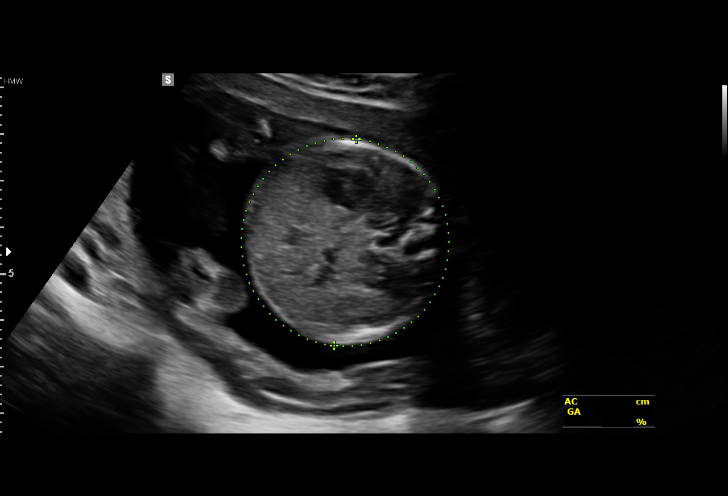
[im 76/98]
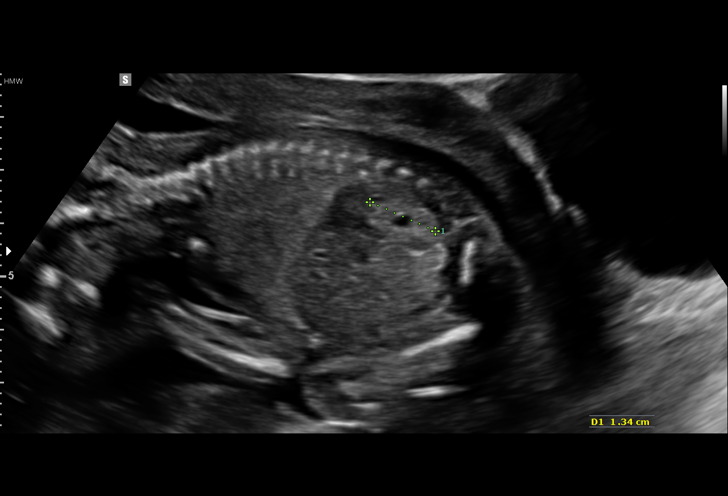
[im 83/98]
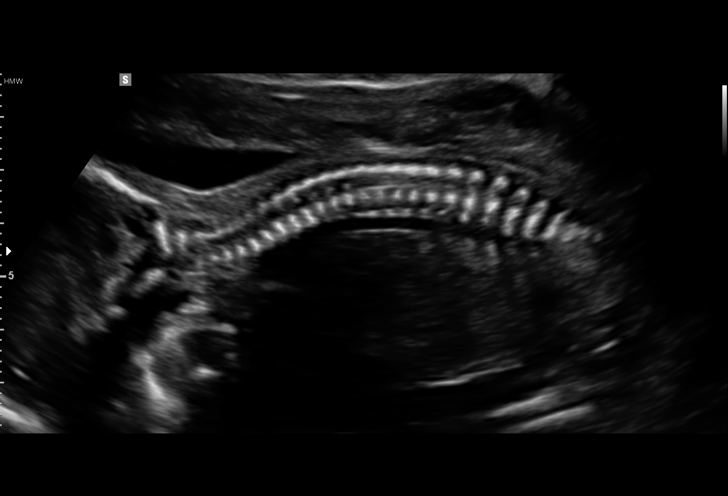
[im 90/98]
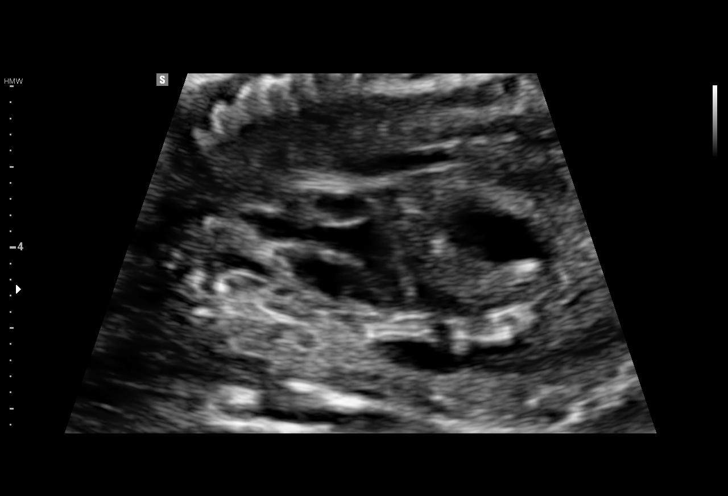
[im 98/98]
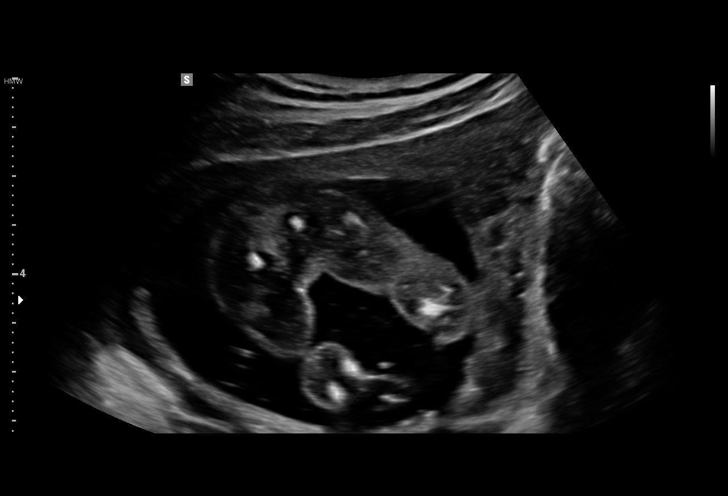

[14 of 28 positions shown; findings below may reference images not displayed]

[REDACTED]-
Faculty Physician

1  ORMELIS MARIETH             [id number]      [phone]     [id number]
Indications

19 weeks gestation of pregnancy
Basic anatomic survey                          Z36
OB History

Gravidity:    1         Term:   0        Prem:   0         SAB:   0
TOP:          0       Ectopic:  0        Living: 0
Fetal Evaluation

Num Of Fetuses:     1
Fetal Heart         151
Rate(bpm):
Cardiac Activity:   Observed
Presentation:       Variable
Placenta:           Fundal, above cervical os
P. Cord Insertion:  Visualized, central

Amniotic Fluid
AFI FV:      Subjectively within normal limits
Larg Pckt:    3.0  cm
Biometry

BPD:      42.2  mm     G. Age:  18w 5d                  CI:         73.33  %    70 - 86
FL/HC:       17.6  %    16.1 -
HC:      156.6  mm     G. Age:  18w 5d         23  %    HC/AC:       1.13       1.09 -
AC:      138.9  mm     G. Age:  19w 2d         56  %    FL/BPD:      65.4  %
FL:       27.6  mm     G. Age:  18w 3d         24  %    FL/AC:       19.9  %    20 - 24
HUM:      27.1  mm     G. Age:  18w 4d         40  %
Est. FW:     261   gm     0 lb 9 oz     42  %
Gestational Age

LMP:           19w 0d        Date:  02/03/15                 EDD:    11/10/15
U/S Today:     18w 6d                                        EDD:    11/11/15
Best:          19w 0d     Det. By:  LMP  (02/03/15)          EDD:    11/10/15
Anatomy

Cranium:          Appears normal         Aortic Arch:      Appears normal
Fetal Cavum:      Appears normal         Ductal Arch:      Not well visualized
Ventricles:       Appears normal         Diaphragm:        Appears normal
Choroid Plexus:   Appears normal         Stomach:          Appears normal, left
sided
Cerebellum:       Appears normal         Abdomen:          Appears normal
Posterior Fossa:  Appears normal         Abdominal Wall:   Appears nml (cord
insert, abd wall)
Nuchal Fold:      Appears normal         Cord Vessels:     Appears normal (3
vessel cord)
Face:             Orbits nl; profile not Kidneys:          Appear normal
well visualized
Lips:             Not well visualized    Bladder:          Appears normal
Fetal Thoracic:   Appears normal         Spine:            Appears normal
Heart:            Appears normal         Upper             Appears normal
(4CH, axis, and        Extremities:
situs)
RVOT:             Appears normal         Lower             Appears normal
Extremities:
LVOT:             Appears normal

Other:  Heels visualized. Technically difficult due to fetal position.
Cervix Uterus Adnexa

Cervix
Length:              3  cm.
Normal appearance by transabdominal scan.

Uterus
No abnormality visualized.

Left Ovary
Not visualized.

Right Ovary
Not visualized.

Cul De Sac:   No free fluid seen.

Adnexa:       No abnormality visualized.
Impression

SIUP at 19+0 weeks
Normal detailed fetal anatomy; limited views of the face and
DA
Markers of aneuploidy: none
Normal amniotic fluid volume
Measurements consistent with LMP dating
Recommendations

Follow-up as clinically indicated

## 2017-11-14 ENCOUNTER — Other Ambulatory Visit (HOSPITAL_COMMUNITY): Payer: Self-pay | Admitting: Family

## 2017-11-14 DIAGNOSIS — Z369 Encounter for antenatal screening, unspecified: Secondary | ICD-10-CM

## 2017-11-18 ENCOUNTER — Encounter (HOSPITAL_COMMUNITY): Payer: Self-pay

## 2017-11-24 ENCOUNTER — Encounter (HOSPITAL_COMMUNITY): Payer: Self-pay | Admitting: *Deleted

## 2017-11-25 ENCOUNTER — Ambulatory Visit (HOSPITAL_COMMUNITY)
Admission: RE | Admit: 2017-11-25 | Discharge: 2017-11-25 | Disposition: A | Discharge status: Home / Self Care | Payer: Medicaid Other | Source: Ambulatory Visit | Attending: Family | Admitting: Family

## 2017-11-25 ENCOUNTER — Encounter (HOSPITAL_COMMUNITY): Payer: Self-pay

## 2017-11-25 ENCOUNTER — Other Ambulatory Visit: Payer: Self-pay

## 2017-11-25 DIAGNOSIS — Z3682 Encounter for antenatal screening for nuchal translucency: Secondary | ICD-10-CM | POA: Diagnosis not present

## 2017-11-25 DIAGNOSIS — Z3491 Encounter for supervision of normal pregnancy, unspecified, first trimester: Secondary | ICD-10-CM | POA: Insufficient documentation

## 2017-11-25 DIAGNOSIS — Z3A13 13 weeks gestation of pregnancy: Secondary | ICD-10-CM | POA: Insufficient documentation

## 2017-11-25 DIAGNOSIS — Z369 Encounter for antenatal screening, unspecified: Secondary | ICD-10-CM

## 2017-12-15 ENCOUNTER — Other Ambulatory Visit (HOSPITAL_COMMUNITY): Payer: Self-pay

## 2018-03-22 NOTE — L&D Delivery Note (Addendum)
Patient is a 28 y.o. now G2P2 s/p NSVD at [redacted]w[redacted]d, who was admitted for IOL for cholestasis. Upon arrival to the hospital she had SROM and went into labor with minimal augmentation.  Delivery Note At 7:16 AM a viable female was delivered via Vaginal, Spontaneous (Presentation: LOA; compound hand).  APGAR: 9, 9; weight pending.   Placenta status: intact.  Cord: 3 vessels with the following complications: none.  Head delivered ROA. No nuchal cord present. Shoulder and body delivered in usual fashion. Infant with spontaneous cry, placed on mother's abdomen, dried and bulb suctioned. Cord clamped x 2 after 1-minute delay, and cut by family member. Cord blood drawn. Placenta delivered spontaneously with gentle cord traction. Fundus firm with massage and Pitocin. Perineum inspected and found to have no laceration with good hemostasis.  Anesthesia: None  Episiotomy: None Lacerations: None Suture Repair: n/a Est. Blood Loss (mL):    Mom to postpartum.  Baby to Couplet care / Skin to Skin.  Destiny Harman, DO [facility], PGY-2 05/18/2018, 7:36 AM  I confirm that I have verified the information documented in the resident's note and that I have also personally reperformed the physical exam and all medical decision making activities.  I was gloved and present for entire delivery SVD without incident No difficulty with shoulders No lacerations  Destiny [name], CNM

## 2018-05-17 ENCOUNTER — Encounter (HOSPITAL_COMMUNITY): Payer: Self-pay | Admitting: *Deleted

## 2018-05-17 ENCOUNTER — Inpatient Hospital Stay (HOSPITAL_COMMUNITY)
Admission: AD | Admit: 2018-05-17 | Discharge: 2018-05-20 | DRG: 805 | Disposition: A | Discharge status: Home / Self Care | Payer: Medicaid Other | Attending: Obstetrics & Gynecology | Admitting: Obstetrics & Gynecology

## 2018-05-17 DIAGNOSIS — K831 Obstruction of bile duct: Secondary | ICD-10-CM | POA: Diagnosis present

## 2018-05-17 DIAGNOSIS — O9962 Diseases of the digestive system complicating childbirth: Principal | ICD-10-CM | POA: Diagnosis present

## 2018-05-17 DIAGNOSIS — Z3A38 38 weeks gestation of pregnancy: Secondary | ICD-10-CM

## 2018-05-17 DIAGNOSIS — O2662 Liver and biliary tract disorders in childbirth: Secondary | ICD-10-CM | POA: Diagnosis present

## 2018-05-17 DIAGNOSIS — O26613 Liver and biliary tract disorders in pregnancy, third trimester: Secondary | ICD-10-CM

## 2018-05-17 DIAGNOSIS — O26619 Liver and biliary tract disorders in pregnancy, unspecified trimester: Secondary | ICD-10-CM

## 2018-05-17 DIAGNOSIS — O26649 Intrahepatic cholestasis of pregnancy, unspecified trimester: Secondary | ICD-10-CM | POA: Diagnosis present

## 2018-05-17 LAB — TYPE AND SCREEN
ABO/RH(D): O POS
Antibody Screen: NEGATIVE

## 2018-05-17 LAB — CBC
HCT: 34.6 % — ABNORMAL LOW (ref 36.0–46.0)
HEMOGLOBIN: 11.1 g/dL — AB (ref 12.0–15.0)
MCH: 28.2 pg (ref 26.0–34.0)
MCHC: 32.1 g/dL (ref 30.0–36.0)
MCV: 87.8 fL (ref 80.0–100.0)
Platelets: 287 10*3/uL (ref 150–400)
RBC: 3.94 MIL/uL (ref 3.87–5.11)
RDW: 14.2 % (ref 11.5–15.5)
WBC: 6.2 10*3/uL (ref 4.0–10.5)
nRBC: 0 % (ref 0.0–0.2)

## 2018-05-17 LAB — ABO/RH: ABO/RH(D): O POS

## 2018-05-17 MED ORDER — MISOPROSTOL 50MCG HALF TABLET
50.0000 ug | ORAL_TABLET | ORAL | Status: DC
  Administered 2018-05-17: 50 ug via ORAL
  Filled 2018-05-17 (×4): qty 1

## 2018-05-17 MED ORDER — LACTATED RINGERS IV SOLN
INTRAVENOUS | Status: DC
  Administered 2018-05-17 – 2018-05-18 (×4): via INTRAVENOUS

## 2018-05-17 MED ORDER — OXYCODONE-ACETAMINOPHEN 5-325 MG PO TABS: 1.0000 | ORAL_TABLET | ORAL | Status: DC | PRN

## 2018-05-17 MED ORDER — LIDOCAINE HCL (PF) 1 % IJ SOLN: 30.0000 mL | INTRAMUSCULAR | Status: DC | PRN

## 2018-05-17 MED ORDER — OXYTOCIN BOLUS FROM INFUSION
500.0000 mL | Freq: Once | INTRAVENOUS | Status: AC
  Administered 2018-05-18: 500 mL via INTRAVENOUS

## 2018-05-17 MED ORDER — ONDANSETRON HCL 4 MG/2ML IJ SOLN: 4.0000 mg | Freq: Four times a day (QID) | INTRAMUSCULAR | Status: DC | PRN

## 2018-05-17 MED ORDER — OXYTOCIN 40 UNITS IN NORMAL SALINE INFUSION - SIMPLE MED
2.5000 [IU]/h | INTRAVENOUS | Status: DC
  Administered 2018-05-18: 2.5 [IU]/h via INTRAVENOUS
  Filled 2018-05-17: qty 1000

## 2018-05-17 MED ORDER — MISOPROSTOL 50MCG HALF TABLET: 50.0000 ug | ORAL_TABLET | Freq: Four times a day (QID) | ORAL | Status: DC

## 2018-05-17 MED ORDER — SOD CITRATE-CITRIC ACID 500-334 MG/5ML PO SOLN: 30.0000 mL | ORAL | Status: DC | PRN

## 2018-05-17 MED ORDER — ACETAMINOPHEN 325 MG PO TABS
650.0000 mg | ORAL_TABLET | ORAL | Status: DC | PRN
  Administered 2018-05-18: 650 mg via ORAL
  Filled 2018-05-17: qty 2

## 2018-05-17 MED ORDER — LACTATED RINGERS IV SOLN
500.0000 mL | INTRAVENOUS | Status: DC | PRN
  Administered 2018-05-18: 500 mL via INTRAVENOUS

## 2018-05-17 MED ORDER — FENTANYL CITRATE (PF) 100 MCG/2ML IJ SOLN
100.0000 ug | INTRAMUSCULAR | Status: DC | PRN
  Administered 2018-05-18: 100 ug via INTRAVENOUS
  Filled 2018-05-17: qty 2

## 2018-05-17 MED ORDER — OXYCODONE-ACETAMINOPHEN 5-325 MG PO TABS: 2.0000 | ORAL_TABLET | ORAL | Status: DC | PRN

## 2018-05-17 NOTE — H&P (Addendum)
LABOR AND DELIVERY ADMISSION HISTORY AND PHYSICAL NOTE  Destiny [name] is a 28 y.o. female G2P1001 with IUP at [redacted]w[redacted]d by LMP presenting for IOL due to cholelithasis.  She reports positive fetal movement. She denies leakage of fluid or vaginal bleeding.  Prenatal History/Complications: PNC at HD Pregnancy complications:  - cholestasis  Past Medical History: Past Medical History:  Diagnosis Date  . Medical history non-contributory     Past Surgical History: Past Surgical History:  Procedure Laterality Date  . NO PAST SURGERIES      Obstetrical History: OB History    Gravida  2   Para  1   Term  1   Preterm      AB      Living  1     SAB      TAB      Ectopic      Multiple  0   Live Births  1           Social History: Social History   Socioeconomic History  . Marital status: Single    Spouse name: Not on file  . Number of children: 1  . Years of education: Not on file  . Highest education level: Not on file  Occupational History  . Not on file  Social Needs  . Financial resource strain: Not on file  . Food insecurity:    Worry: Not on file    Inability: Not on file  . Transportation needs:    Medical: Not on file    Non-medical: Not on file  Tobacco Use  . Smoking status: Never Smoker  . Smokeless tobacco: Never Used  Substance and Sexual Activity  . Alcohol use: No  . Drug use: No  . Sexual activity: Yes  Lifestyle  . Physical activity:    Days per week: Not on file    Minutes per session: Not on file  . Stress: Not on file  Relationships  . Social connections:    Talks on phone: Not on file    Gets together: Not on file    Attends religious service: Not on file    Active member of club or organization: Not on file    Attends meetings of clubs or organizations: Not on file    Relationship status: Not on file  Other Topics Concern  . Not on file  Social History Narrative  . Not on file    Family History: Family History   Problem Relation Age of Onset  . Asthma Father   . High Cholesterol Father   . Diabetes Maternal Grandmother   . Arthritis Paternal Grandmother     Allergies: No Known Allergies  Medications Prior to Admission  Medication Sig Dispense Refill Last Dose  . acetaminophen (TYLENOL) 325 MG tablet Take 650 mg by mouth every 6 (six) hours as needed.   Taking  . Doxylamine Succinate, Sleep, (UNISOM PO) Take by mouth.   Taking  . ibuprofen (ADVIL,MOTRIN) 600 MG tablet Take 1 tablet (600 mg total) by mouth every 6 (six) hours as needed. (Patient not taking: Reported on 11/25/2017) 30 tablet 1 Not Taking  . Prenatal Vit-Fe Fumarate-FA (PRENATAL MULTIVITAMIN) TABS tablet Take 1 tablet by mouth daily at 12 noon.   Taking  . Pyridoxine HCl (B-6 PO) Take by mouth.   Taking     Review of Systems  All systems reviewed and negative except as stated in HPI  Physical Exam Blood pressure 115/89, pulse 83, temperature 98.5  F (36.9 C), temperature source Axillary, resp. rate 16, height 5' (1.524 m), weight 62.1 kg, last menstrual period 08/24/2017, SpO2 100 %, unknown if currently breastfeeding. General appearance: alert, cooperative and no distress Lungs: normal work of breathing Abdomen: gravid Extremities: No calf swelling or tenderness Presentation: cephalic at last check at 36weeks Fetal monitoring: Cat 1 Uterine activity: irregular    Prenatal labs: ABO, Rh: --/--/O POS (02/26 1858) Antibody: PENDING (02/26 1858) Rubella:  immune RPR:   pending HBsAg:   negative HIV:   non-reactive GC/Chlamydia: Neg GBS:   Neg 1 hr Glucola: normal Genetic screening:  normal Anatomy US: small focal echogenic focus, likely left ventricle   Prenatal Transfer Tool  Maternal Diabetes: No Genetic Screening: Normal Maternal Ultrasounds/Referrals: Abnormal:  Findings:   Cardiac defect Fetal Ultrasounds or other Referrals:  None, Fetal echo Maternal Substance Abuse:  No Significant Maternal Medications:   None Significant Maternal Lab Results: None  Results for orders placed or performed during the hospital encounter of 05/17/18 (from the past 24 hour(s))  Type and screen [facility]   Collection Time: 05/17/18  6:58 PM  Result Value Ref Range   ABO/RH(D) O POS    Antibody Screen PENDING    Sample Expiration      05/20/2018 Performed at [facility], [address] [place], [place] [address]   CBC   Collection Time: 05/17/18  7:30 PM  Result Value Ref Range   WBC 6.2 4.0 - 10.5 K/uL   RBC 3.94 3.87 - 5.11 MIL/uL   Hemoglobin 11.1 (L) 12.0 - 15.0 g/dL   HCT 60.4 (L) 54.0 - 98.1 %   MCV 87.8 80.0 - 100.0 fL   MCH 28.2 26.0 - 34.0 pg   MCHC 32.1 30.0 - 36.0 g/dL   RDW 19.1 47.8 - 29.5 %   Platelets 287 150 - 400 K/uL   nRBC 0.0 0.0 - 0.2 %    Patient Active Problem List   Diagnosis Date Noted  . Cholestasis during pregnancy 05/17/2018  . Normal delivery 10/28/2015  . Cholestasis of pregnancy 10/27/2015  . SINUSITIS, MAXILLARY, ACUTE 03/26/2009  . ANEMIA, IRON DEFICIENCY NOS 10/27/2006  . ALLERGIC RHINITIS 10/27/2006  . PRIMARY DYSMENORRHEA 10/27/2006    Assessment: HEATER OSUCH is a 28 y.o. G2P1001 at [redacted]w[redacted]d here for IOL due to cholelithasis  #Labor: induce with oral cytotec #Pain: Anethesia/analgesia prn; epidural on request #FWB: Cat 1 #ID: GBS neg #MOF: breast, possible supplement with bottle #MOC: IUD, Paragaurd #Circ: undecided  Arlyce Harman, DO [facility], PGY-2 05/17/2018, 9:06 PM  I confirm that I have verified the information documented in the Resident student's note and that I have also personally reperformed the history, physical exam and all medical decision making activities of this service and have verified that all service and findings are accurately documented in this student's note.   Plan Foley insertion and Cytotec ripening, then proceed to Pitocin  Aviva Signs, CNM 05/17/2018 11:28 PM

## 2018-05-17 NOTE — MAU Note (Signed)
Pt here for induction for cholestasis, will be in MAU until bed available in L&D.  Having intermittent mild contractions, denies bleeding or LOF.  Reports good fetal movement.

## 2018-05-17 NOTE — Progress Notes (Signed)
Patient ID: Destiny [name], female   DOB: Jun 11, 1990, 28 y.o.   MRN: [id number] Doing well Vitals:   05/17/18 1812 05/17/18 2028 05/17/18 2106  BP: 117/83 115/89 116/83  Pulse:  83 84  Resp: 16 16 16   Temp: 98.7 F (37.1 C) 98.5 F (36.9 C) 98.1 F (36.7 C)  TempSrc: Oral Axillary Oral  SpO2:  100%   Weight: 62.1 kg    Height: 5' (1.524 m)     FHR stable and reactive  Dilation: 1.5 Effacement (%): 60 Cervical Position: Middle Station: -3, -2 Presentation: Vertex Exam by:: Mayford Knife CNM  Will start Cytotec and insert Foley Balloon

## 2018-05-18 ENCOUNTER — Encounter (HOSPITAL_COMMUNITY): Payer: Self-pay

## 2018-05-18 ENCOUNTER — Other Ambulatory Visit: Payer: Self-pay

## 2018-05-18 DIAGNOSIS — K831 Obstruction of bile duct: Secondary | ICD-10-CM

## 2018-05-18 DIAGNOSIS — Z3A38 38 weeks gestation of pregnancy: Secondary | ICD-10-CM

## 2018-05-18 DIAGNOSIS — O2662 Liver and biliary tract disorders in childbirth: Secondary | ICD-10-CM

## 2018-05-18 LAB — RPR: RPR Ser Ql: NONREACTIVE

## 2018-05-18 MED ORDER — SIMETHICONE 80 MG PO CHEW: 80.0000 mg | CHEWABLE_TABLET | ORAL | Status: DC | PRN

## 2018-05-18 MED ORDER — ACETAMINOPHEN 325 MG PO TABS
650.0000 mg | ORAL_TABLET | ORAL | Status: DC | PRN
  Administered 2018-05-18: 650 mg via ORAL
  Filled 2018-05-18: qty 2

## 2018-05-18 MED ORDER — PRENATAL MULTIVITAMIN CH
1.0000 | ORAL_TABLET | Freq: Every day | ORAL | Status: DC
  Administered 2018-05-19 – 2018-05-20 (×2): 1 via ORAL
  Filled 2018-05-18 (×2): qty 1

## 2018-05-18 MED ORDER — LABETALOL HCL 5 MG/ML IV SOLN
INTRAVENOUS | Status: AC
  Filled 2018-05-18: qty 8

## 2018-05-18 MED ORDER — ONDANSETRON HCL 4 MG PO TABS: 4.0000 mg | ORAL_TABLET | ORAL | Status: DC | PRN

## 2018-05-18 MED ORDER — OXYCODONE-ACETAMINOPHEN 5-325 MG PO TABS: 1.0000 | ORAL_TABLET | ORAL | Status: DC | PRN

## 2018-05-18 MED ORDER — DIBUCAINE 1 % RE OINT: 1.0000 "application " | TOPICAL_OINTMENT | RECTAL | Status: DC | PRN

## 2018-05-18 MED ORDER — BENZOCAINE-MENTHOL 20-0.5 % EX AERO
1.0000 "application " | INHALATION_SPRAY | CUTANEOUS | Status: DC | PRN
  Administered 2018-05-18: 1 via TOPICAL
  Filled 2018-05-18: qty 56

## 2018-05-18 MED ORDER — IBUPROFEN 600 MG PO TABS
600.0000 mg | ORAL_TABLET | Freq: Four times a day (QID) | ORAL | Status: DC
  Administered 2018-05-18 – 2018-05-20 (×9): 600 mg via ORAL
  Filled 2018-05-18 (×9): qty 1

## 2018-05-18 MED ORDER — WITCH HAZEL-GLYCERIN EX PADS: 1.0000 "application " | MEDICATED_PAD | CUTANEOUS | Status: DC | PRN

## 2018-05-18 MED ORDER — COCONUT OIL OIL
1.0000 "application " | TOPICAL_OIL | Status: DC | PRN
  Administered 2018-05-18: 1 via TOPICAL
  Filled 2018-05-18: qty 120

## 2018-05-18 MED ORDER — SENNOSIDES-DOCUSATE SODIUM 8.6-50 MG PO TABS
2.0000 | ORAL_TABLET | ORAL | Status: DC
  Administered 2018-05-18 – 2018-05-19 (×2): 2 via ORAL
  Filled 2018-05-18 (×2): qty 2

## 2018-05-18 MED ORDER — DIPHENHYDRAMINE HCL 25 MG PO CAPS: 25.0000 mg | ORAL_CAPSULE | Freq: Four times a day (QID) | ORAL | Status: DC | PRN

## 2018-05-18 MED ORDER — OXYCODONE-ACETAMINOPHEN 5-325 MG PO TABS: 2.0000 | ORAL_TABLET | ORAL | Status: DC | PRN

## 2018-05-18 MED ORDER — ONDANSETRON HCL 4 MG/2ML IJ SOLN: 4.0000 mg | INTRAMUSCULAR | Status: DC | PRN

## 2018-05-18 MED ORDER — TETANUS-DIPHTH-ACELL PERTUSSIS 5-2.5-18.5 LF-MCG/0.5 IM SUSP: 0.5000 mL | Freq: Once | INTRAMUSCULAR | Status: DC

## 2018-05-18 NOTE — Discharge Summary (Signed)
Postpartum Discharge Summary     Patient Name: Destiny [name] DOB: 1990/12/08 MRN: [id number]  Date of admission: 05/17/2018 Delivering Provider: Arlyce Harman   Date of discharge: 05/20/2018  Admitting diagnosis: 38wks, monitoring-induction Intrauterine pregnancy: [redacted]w[redacted]d     Secondary diagnosis:  Active Problems:   Cholestasis during pregnancy   Discharge diagnosis: Term Pregnancy Delivered                                       Post partum procedures:none Augmentation: AROM, Cytotec and Foley Balloon Complications: None  Hospital course:  Induction of Labor With Vaginal Delivery   28 y.o. yo [id number] at [redacted]w[redacted]d was admitted to the hospital 05/17/2018 for induction of labor.  Indication for induction: Cholestasis of pregnancy.  Patient had an uncomplicated labor course as follows: Membrane Rupture Time/Date: 5:59 AM ,05/18/2018   Intrapartum Procedures: Episiotomy: None [1]                                         Lacerations:  None [1]  Patient had delivery of a Viable infant. Details of delivery can be found in separate delivery note.  Patient had a routine postpartum course. Patient is discharged home 05/20/18. Pt plans IUD for contraception.  Magnesium Sulfate recieved: No BMZ received: No  Physical exam  Vitals:   05/19/18 0515 05/19/18 1447 05/19/18 2142 05/20/18 0509  BP: 104/77 103/70 111/69 111/72  Pulse: 65 68 81 65  Resp: 16 19 16 16   Temp: 97.9 F (36.6 C) 98.2 F (36.8 C) 98.1 F (36.7 C) 97.7 F (36.5 C)  TempSrc: Oral Oral Oral Oral  SpO2: 99%  98% 100%  Weight:      Height:       General: alert, well-appearing, NAD Lochia: appropriate Uterine Fundus: firm Incision: N/A DVT Evaluation: No significant calf/ankle edema. Labs: Lab Results  Component Value Date   WBC 6.2 05/17/2018   HGB 11.1 (L) 05/17/2018   HCT 34.6 (L) 05/17/2018   MCV 87.8 05/17/2018   PLT 287 05/17/2018   CMP Latest Ref Rng & Units 04/30/2013  Glucose 70 - 99 mg/dL 94  BUN 6 - 23  mg/dL 11  Creatinine 7.41 - 2.87 mg/dL 8.67  Sodium 672 - 094 mEq/L 140  Potassium 3.5 - 5.3 mEq/L 4.3  Chloride 96 - 112 mEq/L 103  CO2 19 - 32 mEq/L 27  Calcium 8.4 - 10.5 mg/dL 9.0  Total Protein 6.0 - 8.3 g/dL 6.7  Total Bilirubin 0.2 - 1.2 mg/dL 0.4  Alkaline Phos 39 - 117 U/L 79  AST 0 - 37 U/L 17  ALT 0 - 35 U/L 12    Discharge instruction: per After Visit Summary and "Baby and Me Booklet".  After visit meds:  Allergies as of 05/20/2018   No Known Allergies     Medication List    STOP taking these medications   B-6 PO     TAKE these medications   ibuprofen 800 MG tablet Commonly known as:  ADVIL,MOTRIN Take 1 tablet (800 mg total) by mouth 3 (three) times daily. What changed:    medication strength  how much to take  when to take this  reasons to take this   prenatal multivitamin Tabs tablet Take 1 tablet by mouth daily at 12 noon.   senna-docusate  8.6-50 MG tablet Commonly known as:  Senokot-S Take 2 tablets by mouth at bedtime as needed for mild constipation.   TYLENOL 325 MG tablet Generic drug:  acetaminophen Take 650 mg by mouth every 6 (six) hours as needed.   UNISOM PO Take by mouth.       Diet: routine diet Activity: Advance as tolerated. Pelvic rest for 6 weeks.  Follow up Visit: Follow-up Information    Department, [facility]. Schedule an appointment as soon as possible for a visit in 4 week(s).   Why:  You should schedule a postpartum follow-up visit in 4-6 weeks. Please call your clinic to make this appointment.  Contact information: [address] [place] [address] [phone]          Newborn Data: Live born female  Birth Weight: 7 lb 7.4 oz (3385 g) APGAR: 9, 9  Newborn Delivery   Birth date/time:  05/18/2018 07:16:00 Delivery type:  Vaginal, Spontaneous    Baby Feeding: Breast Disposition:home with mother  05/20/2018 Arabella Merles, CNM  9:14 AM

## 2018-05-18 NOTE — Lactation Note (Signed)
This note was copied from a baby's chart. Lactation Consultation Note  Patient Name: Destiny [name] [id number] Date: 05/18/2018 Reason for consult: Initial assessment;Early term 41-38.6wks  14 hours old early term female who is being exclusively BF by his mother, she's a P2.  Mom came as breast/formula, she may start supplementing at some point. She's experienced BF, she was able to BF her first child for 22 months but experienced some BF difficulties in the beginning due to baby being tongue tied. She didn't get it clipped because it was mild and BF got better down the road. Mom doesn't have a pump at home and requested a hand pump from the hospital. Instructions, cleaning and storage were reviewed as well as milk storage guidelines. Mom already familiar with hand expression, when LC reviewed hand expression, she was able to get a big drop out of her left breast. She complained of some tenderness in her nipples, LC rubbed it on them, reviewed prevention and treatment of sore nipples.  Baby was asleep and swaddled in mom's arms when entering the room, offered assistance with latch but mom politely declined stating he just fed. She states that feeding at the breast are not very comfortable and that she believes she needs assistance with latch. Mom hasn't been doing STS, advised her to do so since it will bring baby closer to her for a deeper latch; discussed tips for a deep latch. Asked mom to call for assistance when needed. Discussed normal newborn behavior, cluster feeding and feeding cues.  Feeding plan:  1. Encouraged mom to feed baby STS 8-12 times/24 hours or sooner if feeding cues are present 2. Hand expression and finger/spoon feeding was also encouraged 3. Mom will use her own colostrum and coconut oil for breast care  BF brochure and feeding diary were reviewed. Parents reported all questions and concerns were answered, they're both aware of LC services and will call PRN.    Maternal  Data Formula Feeding for Exclusion: Yes Reason for exclusion: Mother's choice to formula and breast feed on admission Has patient been taught Hand Expression?: Yes Does the patient have breastfeeding experience prior to this delivery?: Yes  Feeding      Interventions Interventions: Coconut oil;Hand express;Breast massage;Breast compression;Breast feeding basics reviewed;Hand pump  Lactation Tools Discussed/Used Tools: Pump;Coconut oil Breast pump type: Manual WIC Program: No Pump Review: Setup, frequency, and cleaning;Milk Storage Initiated by:: MPeck Date initiated:: 05/18/18   Consult Status Consult Status: Follow-up Date: 05/19/18 Follow-up type: In-patient    Jonus Coble Venetia Constable 05/18/2018, 9:56 PM

## 2018-05-18 NOTE — Progress Notes (Signed)
Patient ID: ONOLEE LANDECK, female   DOB: May 10, 1990, 28 y.o.   MRN: [id number] Requesting Nitrous Oxide for pain management  Rates pain an "8"  Vitals:   05/18/18 0024 05/18/18 0141 05/18/18 0236 05/18/18 0412  BP:  108/79 116/85 118/86  Pulse:  70 73 79  Resp: 16 18 18 18   Temp:  98.9 F (37.2 C)    TempSrc:  Oral    SpO2:      Weight:      Height:       Foley came out and patient is laboring on her own  Dilation: 5 Effacement (%): 60 Cervical Position: Middle Station: -3 Presentation: Vertex Exam by:: Gwendolyn Grant, RN   FHR stable and reassuring

## 2018-05-18 NOTE — Progress Notes (Signed)
Destiny [name] is a 28 y.o. G2P1001 at [redacted]w[redacted]d.  Subjective: Patient beginning to feel contractions more but states it is not bad enough to get epidural at this time.  Objective: BP 109/68   Pulse 74   Temp 98.1 F (36.7 C) (Oral)   Resp 16   Ht 5' (1.524 m)   Wt 62.1 kg   LMP 08/24/2017 (Approximate)   SpO2 100% Comment: room air   BMI 26.76 kg/m    FHT:  FHR: 147 bpm, variability: mod,  accelerations:  present,  decelerations:  absent UC:   Q 2-78minutes, irregular Dilation: 4 Effacement (%): 60 Cervical Position: Middle Station: Ballotable Presentation: Vertex Exam by:: Gwendolyn Grant, RN   Labs: Results for orders placed or performed during the hospital encounter of 05/17/18 (from the past 24 hour(s))  Type and screen [facility]     Status: None   Collection Time: 05/17/18  6:58 PM  Result Value Ref Range   ABO/RH(D) O POS    Antibody Screen NEG    Sample Expiration      05/20/2018 Performed at [facility], [address] [place], [place] [address]   CBC     Status: Abnormal   Collection Time: 05/17/18  7:30 PM  Result Value Ref Range   WBC 6.2 4.0 - 10.5 K/uL   RBC 3.94 3.87 - 5.11 MIL/uL   Hemoglobin 11.1 (L) 12.0 - 15.0 g/dL   HCT 01.7 (L) 51.0 - 25.8 %   MCV 87.8 80.0 - 100.0 fL   MCH 28.2 26.0 - 34.0 pg   MCHC 32.1 30.0 - 36.0 g/dL   RDW 52.7 78.2 - 42.3 %   Platelets 287 150 - 400 K/uL   nRBC 0.0 0.0 - 0.2 %  ABO/Rh     Status: None   Collection Time: 05/17/18  7:30 PM  Result Value Ref Range   ABO/RH(D)      O POS Performed at [facility], [address], [place], [place] [address]     Assessment / Plan: [redacted]w[redacted]d week IUP here for IOL due to cholestasis Labor: augmentation with cytotec 50mg  oral q4 and foley bulb Fetal Wellbeing:  Category 1 Pain Control:  Analgesic/anethesia prn; epidural on request Anticipated MOD:  SVD  [facility], PGY-2 05/18/2018 1:37 AM

## 2018-05-19 MED ORDER — SENNOSIDES-DOCUSATE SODIUM 8.6-50 MG PO TABS: 2.0000 | ORAL_TABLET | Freq: Every evening | ORAL | 0 refills | Status: AC | PRN

## 2018-05-19 MED ORDER — IBUPROFEN 800 MG PO TABS: 800.0000 mg | ORAL_TABLET | Freq: Three times a day (TID) | ORAL | 0 refills | Status: AC

## 2018-05-19 NOTE — Lactation Note (Signed)
This note was copied from a baby's chart. Lactation Consultation Note  Patient Name: Destiny [name] [id number] Date: 05/19/2018 Reason for consult: Follow-up assessment;Early term 37-38.6wks;Infant weight loss;Nipple pain/trauma  41 hours old early term female who is now being partially BF and formula fed by his mother, mom started supplementing today due to feeding choice upon admission but also nipple pain. Per MD note, mom is experiencing the same issues she had with her first baby who was tongue tied. MD did not observe a tongue tied but mentioned that baby did not flange his lips when sucking on gloved finger.   Offered assistance with latch but mom is too sore to BF at this point. Asked mom if she'd like to pump instead and take a break from BF for the next 24-48 hours until her nipples heal. Both nipples shows signs of trauma with one positional stripe on each, right in the middle of the nipple. Mom has colostrum, LC assisted with hand expression and rubbed it on her nipples. LC also got some comfort gels for mom and she's aware of not using them together with the coconut oil.  Once mom starts putting baby again on the breast she'll wear her breast shells so her nipples heal faster. Baby on Gerber Gentle in the meantime and parents are aware of formula supplementation guidelines. However, standard flow nipples were brought to the room instead of slow flow. When NT came into the room she replaced them for slow flow nipples.   Mom is expecting her discharge tomorrow, reviewed discharge instructions, engorgement prevention and treatment and treatment and prevention of sore nipples. Discussed red flags on when to call baby's pediatrician. Mom will be using her hand pump while in the hospital even though LC offered to set up a DEBP, she feels more comfortable with the hand pump since that's the one she'll be using when she goes home tomorrow. Mom washed it and will start pumping tonight.  Feeding  plan:  1. Mom will pump every 3 hours and at least once at night and will offer baby any amount of EBM she may get 2. Parents will complete the volumes required for supplementation with Rush Barer Gentle according to baby's age in hours 3. Mom will use her own colostrum and comfort gels to treat nipple trauma 4. Once she puts baby back tot he breast, she'll use coconut oil along with breast shells  Parents reported all questions and concerns were answered, they're both aware of LC OP services and will call PRN.  Maternal Data    Feeding Feeding Type: Breast Fed  Interventions Interventions: Breast feeding basics reviewed;Shells;Comfort gels;Breast compression;Hand express;Breast massage  Lactation Tools Discussed/Used Tools: Shells;Comfort gels   Consult Status Consult Status: PRN Date: 05/20/18 Follow-up type: In-patient    Lylah Lantis Venetia Constable 05/19/2018, 9:58 PM

## 2018-05-19 NOTE — Progress Notes (Signed)
Post Partum Day #1 Subjective: no complaints, up ad lib and tolerating PO; breastfeeding going slowly; plans on IUD for pp contraception; infant is staying until tomorrow  Objective: Blood pressure 104/77, pulse 65, temperature 97.9 F (36.6 C), temperature source Oral, resp. rate 16, height 5' (1.524 m), weight 62.1 kg, last menstrual period 08/24/2017, SpO2 99 %, unknown if currently breastfeeding.  Physical Exam:  General: alert, cooperative and no distress Lochia: appropriate Uterine Fundus: firm DVT Evaluation: No evidence of DVT seen on physical exam.  Recent Labs    05/17/18 1930  HGB 11.1*  HCT 34.6*    Assessment/Plan: Plan for discharge tomorrow   LOS: 2 days   Arabella Merles CNM 05/19/2018, 10:29 AM

## 2018-05-20 ENCOUNTER — Encounter (HOSPITAL_COMMUNITY): Payer: Self-pay | Admitting: Advanced Practice Midwife

## 2018-09-27 ENCOUNTER — Telehealth: Payer: Medicaid Other | Admitting: Family

## 2018-09-27 DIAGNOSIS — Z20828 Contact with and (suspected) exposure to other viral communicable diseases: Secondary | ICD-10-CM

## 2018-09-27 DIAGNOSIS — Z20822 Contact with and (suspected) exposure to covid-19: Secondary | ICD-10-CM

## 2018-09-27 NOTE — Progress Notes (Signed)
E-Visit for Corona Virus Screening   Your current symptoms could be consistent with the coronavirus.  I have sent your note to our [facility] site. They should reach out to you in the next 24 hours to set up a time for testing if you qualify.   Approximately 5 minutes was spent documenting and reviewing patient's chart.    COVID-19 is a respiratory illness with symptoms that are similar to the flu. Symptoms are typically mild to moderate, but there have been cases of severe illness and death due to the virus. The following symptoms may appear 2-14 days after exposure: . Fever . Cough . Shortness of breath or difficulty breathing . Chills . Repeated shaking with chills . Muscle pain . Headache . Sore throat . New loss of taste or smell . Fatigue . Congestion or runny nose . Nausea or vomiting . Diarrhea  It is vitally important that if you feel that you have an infection such as this virus or any other virus that you stay home and away from places where you may spread it to others.  You should self-quarantine for 14 days if you have symptoms that could potentially be coronavirus or have been in close contact a with a person diagnosed with COVID-19 within the last 2 weeks. You should avoid contact with people age 65 and older.   You should wear a mask or cloth face covering over your nose and mouth if you must be around other people or animals, including pets (even at home). Try to stay at least 6 feet away from other people. This will protect the people around you.   You may also take acetaminophen (Tylenol) as needed for fever.   Reduce your risk of any infection by using the same precautions used for avoiding the common cold or flu:  . Wash your hands often with soap and warm water for at least 20 seconds.  If soap and water are not readily available, use an alcohol-based hand sanitizer with at least 60% alcohol.  . If coughing or sneezing, cover your mouth and nose by  coughing or sneezing into the elbow areas of your shirt or coat, into a tissue or into your sleeve (not your hands). . Avoid shaking hands with others and consider head nods or verbal greetings only. . Avoid touching your eyes, nose, or mouth with unwashed hands.  . Avoid close contact with people who are sick. . Avoid places or events with large numbers of people in one location, like concerts or sporting events. . Carefully consider travel plans you have or are making. . If you are planning any travel outside or inside the [place], visit the CDC's Travelers' Health webpage for the latest health notices. . If you have some symptoms but not all symptoms, continue to monitor at home and seek medical attention if your symptoms worsen. . If you are having a medical emergency, call 911.  HOME CARE . Only take medications as instructed by your medical team. . Drink plenty of fluids and get plenty of rest. . A steam or ultrasonic humidifier can help if you have congestion.   GET HELP RIGHT AWAY IF YOU HAVE EMERGENCY WARNING SIGNS** FOR COVID-19. If you or someone is showing any of these signs seek emergency medical care immediately. Call 911 or proceed to your closest emergency facility if: . You develop worsening high fever. . Trouble breathing . Bluish lips or face . Persistent pain or pressure in the chest . New   confusion . Inability to wake or stay awake . You cough up blood. . Your symptoms become more severe  **This list is not all possible symptoms. Contact your medical provider for any symptoms that are sever or concerning to you.   MAKE SURE YOU   Understand these instructions.  Will watch your condition.  Will get help right away if you are not doing well or get worse.  Your e-visit answers were reviewed by a board certified advanced clinical practitioner to complete your personal care plan.  Depending on the condition, your plan could have included both over the counter or  prescription medications.  If there is a problem please reply once you have received a response from your provider.  Your safety is important to us.  If you have drug allergies check your prescription carefully.    You can use MyChart to ask questions about today's visit, request a non-urgent call back, or ask for a work or school excuse for 24 hours related to this e-Visit. If it has been greater than 24 hours you will need to follow up with your provider, or enter a new e-Visit to address those concerns. You will get an e-mail in the next two days asking about your experience.  I hope that your e-visit has been valuable and will speed your recovery. Thank you for using e-visits.
